# Patient Record
Sex: Female | Born: 1990 | Race: Black or African American | Hispanic: No | Marital: Single | Smoking: Never smoker
Health system: Southern US, Community
[De-identification: ages and names within clinical notes are randomized; demographics above are authoritative.]

## PROBLEM LIST (undated history)

## (undated) DIAGNOSIS — R87629 Unspecified abnormal cytological findings in specimens from vagina: Secondary | ICD-10-CM

## (undated) DIAGNOSIS — G35 Multiple sclerosis: Secondary | ICD-10-CM

## (undated) HISTORY — PX: ACHILLES TENDON SURGERY: SHX542

## (undated) HISTORY — DX: Unspecified abnormal cytological findings in specimens from vagina: R87.629

## (undated) HISTORY — PX: ACHILLES TENDON REPAIR: SUR1153

## (undated) HISTORY — PX: WISDOM TOOTH EXTRACTION: SHX21

---

## 2015-07-28 ENCOUNTER — Emergency Department (HOSPITAL_BASED_OUTPATIENT_CLINIC_OR_DEPARTMENT_OTHER): Payer: PRIVATE HEALTH INSURANCE

## 2015-07-28 ENCOUNTER — Encounter (HOSPITAL_BASED_OUTPATIENT_CLINIC_OR_DEPARTMENT_OTHER): Payer: Self-pay | Admitting: Emergency Medicine

## 2015-07-28 ENCOUNTER — Emergency Department (HOSPITAL_BASED_OUTPATIENT_CLINIC_OR_DEPARTMENT_OTHER)
Admission: EM | Admit: 2015-07-28 | Discharge: 2015-07-28 | Disposition: A | Discharge status: Home / Self Care | Payer: PRIVATE HEALTH INSURANCE | Attending: Emergency Medicine | Admitting: Emergency Medicine

## 2015-07-28 DIAGNOSIS — Y998 Other external cause status: Secondary | ICD-10-CM | POA: Insufficient documentation

## 2015-07-28 DIAGNOSIS — Z23 Encounter for immunization: Secondary | ICD-10-CM | POA: Diagnosis not present

## 2015-07-28 DIAGNOSIS — Y92410 Unspecified street and highway as the place of occurrence of the external cause: Secondary | ICD-10-CM | POA: Insufficient documentation

## 2015-07-28 DIAGNOSIS — Z79899 Other long term (current) drug therapy: Secondary | ICD-10-CM | POA: Diagnosis not present

## 2015-07-28 DIAGNOSIS — S29001A Unspecified injury of muscle and tendon of front wall of thorax, initial encounter: Secondary | ICD-10-CM | POA: Diagnosis not present

## 2015-07-28 DIAGNOSIS — S301XXA Contusion of abdominal wall, initial encounter: Secondary | ICD-10-CM

## 2015-07-28 DIAGNOSIS — Y9389 Activity, other specified: Secondary | ICD-10-CM | POA: Insufficient documentation

## 2015-07-28 DIAGNOSIS — S298XXA Other specified injuries of thorax, initial encounter: Secondary | ICD-10-CM

## 2015-07-28 DIAGNOSIS — S7001XA Contusion of right hip, initial encounter: Secondary | ICD-10-CM | POA: Diagnosis not present

## 2015-07-28 DIAGNOSIS — S70211A Abrasion, right hip, initial encounter: Secondary | ICD-10-CM | POA: Insufficient documentation

## 2015-07-28 DIAGNOSIS — Z3202 Encounter for pregnancy test, result negative: Secondary | ICD-10-CM | POA: Diagnosis not present

## 2015-07-28 DIAGNOSIS — S79911A Unspecified injury of right hip, initial encounter: Secondary | ICD-10-CM | POA: Diagnosis present

## 2015-07-28 LAB — PREGNANCY, URINE: Preg Test, Ur: NEGATIVE

## 2015-07-28 MED ORDER — HYDROCODONE-ACETAMINOPHEN 5-325 MG PO TABS: 1.0000 | ORAL_TABLET | Freq: Four times a day (QID) | ORAL | Status: DC | PRN

## 2015-07-28 MED ORDER — TETANUS-DIPHTH-ACELL PERTUSSIS 5-2.5-18.5 LF-MCG/0.5 IM SUSP
0.5000 mL | Freq: Once | INTRAMUSCULAR | Status: AC
  Administered 2015-07-28: 0.5 mL via INTRAMUSCULAR
  Filled 2015-07-28: qty 0.5

## 2015-07-28 MED ORDER — HYDROCODONE-ACETAMINOPHEN 5-325 MG PO TABS
1.0000 | ORAL_TABLET | Freq: Once | ORAL | Status: AC
  Administered 2015-07-28: 1 via ORAL
  Filled 2015-07-28: qty 1

## 2015-07-28 NOTE — ED Notes (Signed)
Pt rates pain 9/10 while sitting in bed.  There are no nonverbal signs of pain. Pt smiling and interacting with staff and family normally.

## 2015-07-28 NOTE — ED Notes (Signed)
Wound care done to abrasion on right groin area.  Cleansed with Saf Clens.  Abx ointment applied followed by nonstick dsg, gauze and Tegaderm.

## 2015-07-28 NOTE — ED Notes (Signed)
MD at bedside. 

## 2015-07-28 NOTE — ED Notes (Signed)
MD at bedside discussing test results and dispo plan of care. 
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
## 2015-08-22 DIAGNOSIS — G35 Multiple sclerosis: Secondary | ICD-10-CM | POA: Insufficient documentation

## 2015-09-29 DIAGNOSIS — Q2112 Patent foramen ovale: Secondary | ICD-10-CM | POA: Insufficient documentation
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
## 2016-09-28 DIAGNOSIS — H469 Unspecified optic neuritis: Secondary | ICD-10-CM | POA: Insufficient documentation

## 2016-09-28 DIAGNOSIS — G35 Multiple sclerosis: Secondary | ICD-10-CM | POA: Insufficient documentation

## 2019-04-23 DIAGNOSIS — D849 Immunodeficiency, unspecified: Secondary | ICD-10-CM | POA: Insufficient documentation

## 2019-10-15 ENCOUNTER — Emergency Department (INDEPENDENT_AMBULATORY_CARE_PROVIDER_SITE_OTHER)
Admission: EM | Admit: 2019-10-15 | Discharge: 2019-10-15 | Disposition: A | Discharge status: Home / Self Care | Payer: BLUE CROSS/BLUE SHIELD | Source: Home / Self Care

## 2019-10-15 ENCOUNTER — Other Ambulatory Visit: Payer: Self-pay

## 2019-10-15 ENCOUNTER — Encounter: Payer: Self-pay | Admitting: Emergency Medicine

## 2019-10-15 ENCOUNTER — Emergency Department (INDEPENDENT_AMBULATORY_CARE_PROVIDER_SITE_OTHER): Payer: BLUE CROSS/BLUE SHIELD

## 2019-10-15 DIAGNOSIS — M25571 Pain in right ankle and joints of right foot: Secondary | ICD-10-CM

## 2019-10-15 DIAGNOSIS — S86001A Unspecified injury of right Achilles tendon, initial encounter: Secondary | ICD-10-CM

## 2019-10-15 DIAGNOSIS — M7660 Achilles tendinitis, unspecified leg: Secondary | ICD-10-CM

## 2019-10-15 DIAGNOSIS — M79661 Pain in right lower leg: Secondary | ICD-10-CM

## 2019-10-15 HISTORY — DX: Multiple sclerosis: G35

## 2019-10-15 NOTE — Discharge Instructions (Signed)
  You may take 500mg  acetaminophen every 4-6 hours or in combination with ibuprofen 400-600mg  every 6-8 hours as needed for pain and inflammation.   Naproxen/Aleve is okay to take instead of ibuprofen if you prefer.  Call Sports Medicine or an Orthopedist tomorrow to schedule a follow up visit for further evaluation and treatment of your suspected partial achilles tear.  You may remove the boot to bath and apply a cool compress, keep foot elevated to help with pain and swelling. Try to avoid putting weight on your foot until you follow up with the specialist.

## 2019-10-15 NOTE — ED Triage Notes (Signed)
RT ankle, foot injury today playing basketball, 5/10

## 2019-10-15 NOTE — ED Provider Notes (Addendum)
[facility]    CSN: [id number] Arrival date & time: 10/15/19  1837      History   Chief Complaint Chief Complaint  Patient presents with  . Ankle Injury    HPI Teresa Davila is a 29 y.o. female.   HPI Teresa Davila is a 29 y.o. female presenting to UC with c/o sudde onset Right ankle pain that occurred about 2 hours PTA, hyper dorsiflexed foot while playing basketball. Pain is aching and sore into her Right calf. Minimal pain at rest. No medication taken PTA.     Past Medical History:  Diagnosis Date  . Multiple sclerosis (HCC)     There are no problems to display for this patient.   History reviewed. No pertinent surgical history.  OB History   No obstetric history on file.      Home Medications    Prior to Admission medications   Medication Sig Start Date End Date Taking? Authorizing Provider  ocrelizumab 300 mg in sodium chloride 0.9 % 250 mL Inject 300 mg into the vein once.   Yes [provider]  cetirizine (ZYRTEC) 10 MG tablet Take 10 mg by mouth daily.    [provider]    Family History Family History  Problem Relation Age of Onset  . Healthy Mother     Social History Social History   Tobacco Use  . Smoking status: Never Smoker  . Smokeless tobacco: Never Used  Vaping Use  . Vaping Use: Never used  Substance Use Topics  . Alcohol use: Yes  . Drug use: No     Allergies   Patient has no known allergies.   Review of Systems Review of Systems  Musculoskeletal: Positive for arthralgias, gait problem and myalgias. Negative for joint swelling.  Skin: Negative for color change and wound.     Physical Exam Triage Vital Signs ED Triage Vitals [10/15/19 1857]  Enc Vitals Group     BP 127/81     Pulse Rate 93     Resp      Temp 98.7 F (37.1 C)     Temp Source Oral     SpO2      Weight 171 lb (77.6 kg)     Height 5\' 2"  (1.575 m)     Head Circumference      Peak Flow      Pain Score 5     Pain  Loc      Pain Edu?      Excl. in GC?    No data found.  Updated Vital Signs BP 127/81 (BP Location: Right Arm)   Pulse 93   Temp 98.7 F (37.1 C) (Oral)   Ht 5\' 2"  (1.575 m)   Wt 171 lb (77.6 kg)   LMP 09/29/2019 (Exact Date)   BMI 31.28 kg/m   Visual Acuity Right Eye Distance:   Left Eye Distance:   Bilateral Distance:    Right Eye Near:   Left Eye Near:    Bilateral Near:     Physical Exam Vitals and nursing note reviewed.  Constitutional:      Appearance: Normal appearance. She is well-developed.  HENT:     Head: Normocephalic and atraumatic.  Cardiovascular:     Rate and Rhythm: Normal rate.     Pulses:          Dorsalis pedis pulses are 2+ on the right side.       Posterior tibial pulses are 2+ on the right  side.  Pulmonary:     Effort: Pulmonary effort is normal.  Musculoskeletal:        General: Normal range of motion.     Cervical back: Normal range of motion.       Legs:     Comments: Right ankle: no obvious deformity or swelling. Mild tenderness to mid calf and achilles. Muscle compartment is soft. Full ROM, increased pain with end range.  No tenderness over malleoli.  No tenderness to foot.  Full ROM knee, ankle and toes.  Skin:    General: Skin is warm and dry.     Findings: No bruising or erythema.  Neurological:     Mental Status: She is alert and oriented to person, place, and time.  Psychiatric:        Behavior: Behavior normal.      UC Treatments / Results  Labs (all labs ordered are listed, but only abnormal results are displayed) Labs Reviewed - No data to display  EKG   Radiology DG Ankle Complete Right  Result Date: 10/15/2019 CLINICAL DATA:  Injured right ankle 2 hours ago playing basketball, pain in Achilles region EXAM: RIGHT ANKLE - COMPLETE 3+ VIEW COMPARISON:  None. FINDINGS: Frontal, oblique, and lateral views of the right ankle are obtained. There is soft tissue edema within the posterior hindfoot in the region of the  Achilles. The Achilles tendon is not poorly visualized, please correlate with physical exam findings for possible Achilles tendon rupture. No acute displaced fracture, subluxation, or dislocation. The joint spaces are well preserved. IMPRESSION: 1. Soft tissue edema in the posterior ankle, with poor visualization of the Achilles tendon. This may reflect rupture, please correlate with physical exam findings. 2. No acute fracture. Electronically Signed   By: Sharlet Salina M.D.   On: 10/15/2019 19:36    Procedures Procedures (including critical care time)  Medications Ordered in UC Medications - No data to display  Initial Impression / Assessment and Plan / UC Course  I have reviewed the triage vital signs and the nursing notes.  Pertinent labs & imaging results that were available during my care of the patient were reviewed by me and considered in my medical decision making (see chart for details).     Reviewed imaging with pt Concern for partial achilles tendon tear. Will place pt in tall walking boot, crutches provided to remain non-weightbearing. Encouraged to call orthopedist or sports medicine tomorrow to schedule further evaluation of injury. Due to insurance, pt would like to go to [facility], encouraged to call her insurance for recommendations on in-network providers. AVS given  Final Clinical Impressions(s) / UC Diagnoses   Final diagnoses:  Right ankle pain  Achilles tendon pain  Achilles tendon injury, right, initial encounter  Right calf pain     Discharge Instructions      You may take 500mg  acetaminophen every 4-6 hours or in combination with ibuprofen 400-600mg  every 6-8 hours as needed for pain and inflammation.   Naproxen/Aleve is okay to take instead of ibuprofen if you prefer.  Call Sports Medicine or an Orthopedist tomorrow to schedule a follow up visit for further evaluation and treatment of your suspected partial achilles tear.  You may remove the boot  to bath and apply a cool compress, keep foot elevated to help with pain and swelling. Try to avoid putting weight on your foot until you follow up with the specialist.     ED Prescriptions    None     I  have reviewed the PDMP during this encounter.   Lurene Shadow, PA-C 10/15/19 1957    Lurene Shadow, PA-C 10/15/19 1959

## 2020-05-25 ENCOUNTER — Other Ambulatory Visit (HOSPITAL_COMMUNITY)
Admission: RE | Admit: 2020-05-25 | Discharge: 2020-05-25 | Disposition: A | Discharge status: Home / Self Care | Payer: 59 | Source: Ambulatory Visit | Attending: Obstetrics and Gynecology | Admitting: Obstetrics and Gynecology

## 2020-05-25 ENCOUNTER — Other Ambulatory Visit: Payer: Self-pay

## 2020-05-25 ENCOUNTER — Encounter: Payer: Self-pay | Admitting: Obstetrics and Gynecology

## 2020-05-25 ENCOUNTER — Ambulatory Visit (INDEPENDENT_AMBULATORY_CARE_PROVIDER_SITE_OTHER): Payer: 59 | Admitting: Obstetrics and Gynecology

## 2020-05-25 VITALS — BP 126/87 | HR 83 | Ht 62.0 in | Wt 185.0 lb

## 2020-05-25 DIAGNOSIS — Z113 Encounter for screening for infections with a predominantly sexual mode of transmission: Secondary | ICD-10-CM

## 2020-05-25 NOTE — Progress Notes (Signed)
Last pap- 2021- normal

## 2020-05-25 NOTE — Progress Notes (Signed)
    GYNECOLOGY ENCOUNTER NOTE  History:     Teresa Davila is a 30 y.o. G0P0000 female here for STI screening. She recently had sex with her partner and the condom broke. No discharge or odor. Just wants to be screened. Last pap and annual was with [facility] and was normal. Results can be found in care everywhere.      Gynecologic History Patient's last menstrual period was 05/20/2020. Contraception: none Last Pap: 02/11/20 & 08/19/2017. Results were: normal with negative HPV, 2019 results in care everywhere.  Last mammogram: NA  Obstetric History OB History  Gravida Para Term Preterm AB Living  0 0 0 0 0 0  SAB IAB Ectopic Multiple Live Births  0 0 0 0 0    Past Medical History:  Diagnosis Date  . Multiple sclerosis (HCC)     Past Surgical History:  Procedure Laterality Date  . ACHILLES TENDON REPAIR      Current Outpatient Medications on File Prior to Visit  Medication Sig Dispense Refill  . Multiple Vitamin (MULTIVITAMIN) LIQD Take 5 mLs by mouth daily.     No current facility-administered medications on file prior to visit.    No Known Allergies  Social History:  reports that she has never smoked. She has never used smokeless tobacco. She reports current alcohol use. She reports that she does not use drugs.  Family History  Problem Relation Age of Onset  . Healthy Mother     The following portions of the patient's history were reviewed and updated as appropriate: allergies, current medications, past family history, past medical history, past social history, past surgical history and problem list.  Review of Systems Pertinent items noted in HPI and remainder of comprehensive ROS otherwise negative.  Physical Exam:  BP 126/87   Pulse 83   Ht 5\' 2"  (1.575 m)   Wt 185 lb (83.9 kg)   LMP 05/20/2020   BMI 33.84 kg/m  CONSTITUTIONAL: Well-developed, well-nourished female in no acute distress.  HENT:  Normocephalic, atraumatic, External right and left ear normal.   MUSCULOSKELETAL: Normal range of motion. No tenderness.   NEUROLOGIC: Alert and oriented to person, place, and time.  PSYCHIATRIC: Normal mood and affect. Normal behavior. Normal judgment and thought content. CARDIOVASCULAR: Normal heart rate noted, regular rhythm PELVIC: Normal appearing external genitalia and urethral meatus.  Normal uterine size, no other palpable masses, no uterine or adnexal tenderness.  Performed in the presence of a chaperone.   Assessment and Plan:  1. Routine screening for STI (sexually transmitted infection)  - HIV antibody (with reflex) - RPR - Hepatitis C Antibody - [facility] - Annual do Oct 2022   Routine preventative health maintenance measures emphasized. Please refer to After Visit Summary for other counseling recommendations.   Rasch, Nov 2022, NP [facility], [facility]

## 2020-05-26 LAB — HEPATITIS C ANTIBODY
Hepatitis C Ab: NONREACTIVE
SIGNAL TO CUT-OFF: 0.02 (ref <1.00)

## 2020-05-26 LAB — RPR: RPR Ser Ql: NONREACTIVE

## 2020-05-26 LAB — HIV ANTIBODY (ROUTINE TESTING W REFLEX): HIV 1&2 Ab, 4th Generation: NONREACTIVE

## 2020-05-27 LAB — CERVICOVAGINAL ANCILLARY ONLY
Bacterial Vaginitis (gardnerella): POSITIVE — AB
Candida Glabrata: NEGATIVE
Candida Vaginitis: NEGATIVE
Chlamydia: NEGATIVE
Comment: NEGATIVE
Comment: NEGATIVE
Comment: NEGATIVE
Comment: NEGATIVE
Comment: NEGATIVE
Comment: NORMAL
Neisseria Gonorrhea: NEGATIVE
Trichomonas: NEGATIVE

## 2020-06-23 DIAGNOSIS — G35 Multiple sclerosis: Secondary | ICD-10-CM | POA: Diagnosis not present

## 2020-08-15 ENCOUNTER — Other Ambulatory Visit: Payer: Self-pay

## 2020-08-15 ENCOUNTER — Encounter: Payer: Self-pay | Admitting: Medical-Surgical

## 2020-08-15 ENCOUNTER — Ambulatory Visit: Payer: 59 | Admitting: Medical-Surgical

## 2020-08-15 VITALS — BP 120/78 | HR 90 | Temp 99.1°F | Ht 62.5 in | Wt 187.9 lb

## 2020-08-15 DIAGNOSIS — Z0000 Encounter for general adult medical examination without abnormal findings: Secondary | ICD-10-CM

## 2020-08-15 DIAGNOSIS — Z7689 Persons encountering health services in other specified circumstances: Secondary | ICD-10-CM | POA: Diagnosis not present

## 2020-08-15 NOTE — Progress Notes (Signed)
New Patient Office Visit  Subjective:  Patient ID: Teresa Davila, female    DOB: 07-23-1990  Age: 30 y.o. MRN: [id number]  CC:  Chief Complaint  Patient presents with  . Establish Care    HPI Teresa Davila presents to establish care.  MS- well controlled at this time. It's been a while since the last flare. Does Ocrevus infusions every 6 months.   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 UTD on Pap smears  Past Medical History:  Diagnosis Date  . Multiple sclerosis (HCC)     Past Surgical History:  Procedure Laterality Date  . ACHILLES TENDON REPAIR Right     Family History  Problem Relation Age of Onset  . Healthy Mother   . Cancer Maternal Grandmother     Social History   Socioeconomic History  . Marital status: Single    Spouse name: Not on file  . Number of children: Not on file  . Years of education: Not on file  . Highest education level: Not on file  Occupational History  . Not on file  Tobacco Use  . Smoking status: Never Smoker  . Smokeless tobacco: Never Used  Vaping Use  . Vaping Use: Never used  Substance and Sexual Activity  . Alcohol use: Yes    Comment: occasionally  . Drug use: Never  . Sexual activity: Yes  Other Topics Concern  . Not on file  Social History Narrative  . Not on file   Social Determinants of Health   Financial Resource Strain: Not on file  Food Insecurity: Not on file  Transportation Needs: Not on file  Physical Activity: Not on file  Stress: Not on file  Social Connections: Not on file  Intimate Partner Violence: Not on file    ROS Review of Systems  Constitutional:  Negative for chills, fatigue, fever and unexpected weight change.  Eyes: Negative for visual disturbance.  Respiratory: Negative for cough, chest tightness, shortness of breath and wheezing.   Cardiovascular: Negative for chest pain, palpitations and leg swelling.  Gastrointestinal: Negative for abdominal pain, constipation, diarrhea, nausea and vomiting.  Endocrine: Positive for heat intolerance. Negative for cold intolerance, polydipsia, polyphagia and polyuria.  Genitourinary: Negative for dysuria, frequency and urgency.  Allergic/Immunologic: Positive for environmental allergies.  Neurological: Negative for dizziness, light-headedness and headaches.  Psychiatric/Behavioral: Negative for dysphoric mood, self-injury, sleep disturbance and suicidal ideas. The patient is not nervous/anxious.    Objective:   Today's Vitals: BP 120/78   Pulse 90   Temp 99.1 F (37.3 C)   Ht 5' 2.5" (1.588 m)   Wt 187 lb 14.4 oz (85.2 kg)   LMP 08/07/2020   SpO2 96%   BMI 33.82 kg/m   Physical Exam Vitals and nursing note reviewed. Exam conducted with a chaperone present.  Constitutional:      General: She is not in acute distress.    Appearance: Normal appearance. She is obese. She is not ill-appearing.  HENT:     Head: Normocephalic and atraumatic.     Right Ear: Tympanic membrane, ear canal and external ear normal. There is no impacted cerumen.     Left Ear: Tympanic membrane, ear canal and external  ear normal. There is no impacted cerumen.     Nose:     Right Turbinates: Not swollen.     Left Turbinates: Not swollen.     Right Sinus: No maxillary sinus tenderness or frontal sinus tenderness.     Left Sinus: No maxillary sinus tenderness or frontal sinus tenderness.     Mouth/Throat:     Mouth: No oral lesions.     Tonsils: No tonsillar exudate or tonsillar abscesses.  Eyes:     General:        Right eye: No discharge.        Left eye: No discharge.     Extraocular Movements: Extraocular  movements intact.     Conjunctiva/sclera: Conjunctivae normal.     Pupils: Pupils are equal, round, and reactive to light.  Neck:     Thyroid: No thyromegaly.     Vascular: No carotid bruit.  Cardiovascular:     Rate and Rhythm: Normal rate and regular rhythm.     Pulses: Normal pulses.     Heart sounds: Normal heart sounds. No murmur heard. No friction rub. No gallop.   Pulmonary:     Effort: Pulmonary effort is normal. No respiratory distress.     Breath sounds: Normal breath sounds. No decreased breath sounds or wheezing.  Chest:  Breasts: Breasts are symmetrical.     Right: Normal.     Left: Normal.    Abdominal:     General: Bowel sounds are normal. There is no distension.     Palpations: Abdomen is soft. There is no hepatomegaly, splenomegaly or mass.     Tenderness: There is no abdominal tenderness. There is no right CVA tenderness, left CVA tenderness, guarding or rebound.  Genitourinary:    General: Normal vulva.     Pubic Area: No rash.      Vagina: No vaginal discharge.     Cervix: No friability.     Uterus: Normal. Not tender and no uterine prolapse.      Adnexa: Right adnexa normal and left adnexa normal.       Right: No mass or tenderness.         Left: No mass or tenderness.       Rectum: Normal.  Musculoskeletal:        General: No swelling or tenderness. Normal range of motion.     Right shoulder: Normal.     Left shoulder: Normal.     Right upper arm: Normal.     Left upper arm: Normal.     Right elbow: Normal.     Left elbow: Normal.     Right forearm: Normal.     Left forearm: Normal.     Right wrist: Normal.     Left wrist: Normal.     Right hand: Normal.     Left hand: Normal.     Cervical back: Normal, normal range of motion and neck supple. No tenderness.     Thoracic back: Normal.     Lumbar back: Normal.     Right lower leg: Normal. No edema.     Left lower leg: Normal. No edema.     Right ankle: Normal.     Right Achilles Tendon:  Normal.     Left ankle: Normal.     Left Achilles Tendon: Normal.     Right foot: Normal.     Left foot: Normal.  Lymphadenopathy:     Cervical: No cervical adenopathy.  Skin:  General: Skin is warm and dry.     Coloration: Skin is not pale.     Findings: Lesion (Small pink lesion in the left inguinal crease of the pubic mons, no drainage or erythema, healing well.) present. No rash.  Neurological:     Mental Status: She is alert and oriented to person, place, and time.     Sensory: No sensory deficit.     Motor: No weakness.     Gait: Gait normal.     Deep Tendon Reflexes: Reflexes are normal and symmetric.  Psychiatric:        Attention and Perception: Attention normal.        Mood and Affect: Mood and affect normal.        Speech: Speech normal.        Behavior: Behavior normal.        Thought Content: Thought content normal.        Cognition and Memory: Cognition normal.        Judgment: Judgment normal.     Assessment & Plan:   1. Encounter to establish care Reviewed available information and discussed healthcare concerns with patient.  She is up-to-date on preventative care.  2. Annual physical exam Checking CBC, CMP, and lipid panel today. - CBC - COMPLETE METABOLIC PANEL WITH GFR - Lipid panel   Outpatient Encounter Medications as of 08/15/2020  Medication Sig  . loratadine (CLARITIN) 10 MG tablet Take 10 mg by mouth daily.  Marland Kitchen ocrelizumab 300 mg in sodium chloride 0.9 % 250 mL Inject 300 mg into the vein once.  . Multiple Vitamin (MULTIVITAMIN) LIQD Take 5 mLs by mouth daily.   No facility-administered encounter medications on file as of 08/15/2020.    Follow-up: Return in about 1 year (around 08/15/2021) for annual physical exam or sooner if needed.   Thayer Ohm, DNP, APRN, FNP-BC [facility]

## 2020-08-15 NOTE — Patient Instructions (Signed)
Preventive Care 21-29 Years Old, Female Preventive care refers to lifestyle choices and visits with your health care provider that can promote health and wellness. This includes:  A yearly physical exam. This is also called an annual wellness visit.  Regular dental and eye exams.  Immunizations.  Screening for certain conditions.  Healthy lifestyle choices, such as: ? Eating a healthy diet. ? Getting regular exercise. ? Not using drugs or products that contain nicotine and tobacco. ? Limiting alcohol use. What can I expect for my preventive care visit? Physical exam Your health care provider may check your:  Height and weight. These may be used to calculate your BMI (body mass index). BMI is a measurement that tells if you are at a healthy weight.  Heart rate and blood pressure.  Body temperature.  Skin for abnormal spots. Counseling Your health care provider may ask you questions about your:  Past medical problems.  Family's medical history.  Alcohol, tobacco, and drug use.  Emotional well-being.  Home life and relationship well-being.  Sexual activity.  Diet, exercise, and sleep habits.  Work and work environment.  Access to firearms.  Method of birth control.  Menstrual cycle.  Pregnancy history. What immunizations do I need? Vaccines are usually given at various ages, according to a schedule. Your health care provider will recommend vaccines for you based on your age, medical history, and lifestyle or other factors, such as travel or where you work.   What tests do I need? Blood tests  Lipid and cholesterol levels. These may be checked every 5 years starting at age 20.  Hepatitis C test.  Hepatitis B test. Screening  Diabetes screening. This is done by checking your blood sugar (glucose) after you have not eaten for a while (fasting).  STD (sexually transmitted disease) testing, if you are at risk.  BRCA-related cancer screening. This may be  done if you have a family history of breast, ovarian, tubal, or peritoneal cancers.  Pelvic exam and Pap test. This may be done every 3 years starting at age 21. Starting at age 30, this may be done every 5 years if you have a Pap test in combination with an HPV test. Talk with your health care provider about your test results, treatment options, and if necessary, the need for more tests.   Follow these instructions at home: Eating and drinking  Eat a healthy diet that includes fresh fruits and vegetables, whole grains, lean protein, and low-fat dairy products.  Take vitamin and mineral supplements as recommended by your health care provider.  Do not drink alcohol if: ? Your health care provider tells you not to drink. ? You are pregnant, may be pregnant, or are planning to become pregnant.  If you drink alcohol: ? Limit how much you have to 0-1 drink a day. ? Be aware of how much alcohol is in your drink. In the [place] one drink equals one 12 oz bottle of beer (355 mL), one 5 oz glass of wine (148 mL), or one 1 oz glass of hard liquor (44 mL).   Lifestyle  Take daily care of your teeth and gums. Brush your teeth every morning and night with fluoride toothpaste. Floss one time each day.  Stay active. Exercise for at least 30 minutes 5 or more days each week.  Do not use any products that contain nicotine or tobacco, such as cigarettes, e-cigarettes, and chewing tobacco. If you need help quitting, ask your health care provider.  Do not   use drugs.  If you are sexually active, practice safe sex. Use a condom or other form of protection to prevent STIs (sexually transmitted infections).  If you do not wish to become pregnant, use a form of birth control. If you plan to become pregnant, see your health care provider for a prepregnancy visit.  Find healthy ways to cope with stress, such as: ? Meditation, yoga, or listening to music. ? Journaling. ? Talking to a trusted  person. ? Spending time with friends and family. Safety  Always wear your seat belt while driving or riding in a vehicle.  Do not drive: ? If you have been drinking alcohol. Do not ride with someone who has been drinking. ? When you are tired or distracted. ? While texting.  Wear a helmet and other protective equipment during sports activities.  If you have firearms in your house, make sure you follow all gun safety procedures.  Seek help if you have been physically or sexually abused. What's next?  Go to your health care provider once a year for an annual wellness visit.  Ask your health care provider how often you should have your eyes and teeth checked.  Stay up to date on all vaccines. This information is not intended to replace advice given to you by your health care provider. Make sure you discuss any questions you have with your health care provider. Document Revised: 11/29/2019 Document Reviewed: 12/12/2017 Elsevier Patient Education  2021 Elsevier Inc.  

## 2020-08-16 ENCOUNTER — Encounter: Payer: Self-pay | Admitting: Medical-Surgical

## 2020-08-16 LAB — COMPLETE METABOLIC PANEL WITH GFR
AG Ratio: 1.6 (calc) (ref 1.0–2.5)
ALT: 12 U/L (ref 6–29)
AST: 14 U/L (ref 10–30)
Albumin: 4.4 g/dL (ref 3.6–5.1)
Alkaline phosphatase (APISO): 72 U/L (ref 31–125)
BUN: 13 mg/dL (ref 7–25)
CO2: 27 mmol/L (ref 20–32)
Calcium: 9.9 mg/dL (ref 8.6–10.2)
Chloride: 103 mmol/L (ref 98–110)
Creat: 0.89 mg/dL (ref 0.50–1.10)
GFR, Est African American: 102 mL/min/{1.73_m2} (ref >=60)
GFR, Est Non African American: 88 mL/min/{1.73_m2} (ref >=60)
Globulin: 2.7 g/dL (calc) (ref 1.9–3.7)
Glucose, Bld: 97 mg/dL (ref 65–139)
Potassium: 4.2 mmol/L (ref 3.5–5.3)
Sodium: 138 mmol/L (ref 135–146)
Total Bilirubin: 0.4 mg/dL (ref 0.2–1.2)
Total Protein: 7.1 g/dL (ref 6.1–8.1)

## 2020-08-16 LAB — CBC
HCT: 43.1 % (ref 35.0–45.0)
Hemoglobin: 14.4 g/dL (ref 11.7–15.5)
MCH: 30.3 pg (ref 27.0–33.0)
MCHC: 33.4 g/dL (ref 32.0–36.0)
MCV: 90.7 fL (ref 80.0–100.0)
MPV: 10.7 fL (ref 7.5–12.5)
Platelets: 448 10*3/uL — ABNORMAL HIGH (ref 140–400)
RBC: 4.75 10*6/uL (ref 3.80–5.10)
RDW: 12.1 % (ref 11.0–15.0)
WBC: 7.8 10*3/uL (ref 3.8–10.8)

## 2020-08-16 LAB — LIPID PANEL
Cholesterol: 237 mg/dL — ABNORMAL HIGH (ref <200)
HDL: 74 mg/dL (ref >=50)
LDL Cholesterol (Calc): 147 mg/dL (calc) — ABNORMAL HIGH
Non-HDL Cholesterol (Calc): 163 mg/dL (calc) — ABNORMAL HIGH (ref <130)
Total CHOL/HDL Ratio: 3.2 (calc) (ref <5.0)
Triglycerides: 63 mg/dL (ref <150)

## 2020-11-13 IMAGING — DX DG ANKLE COMPLETE 3+V*R*
3 series · 3 of 3 positions shown · non-contrast
Comparison: None.

CLINICAL DATA: Injured right ankle 2 hours ago playing basketball,
pain in Achilles region

EXAM:
RIGHT ANKLE - COMPLETE 3+ VIEW

[ankle ap]
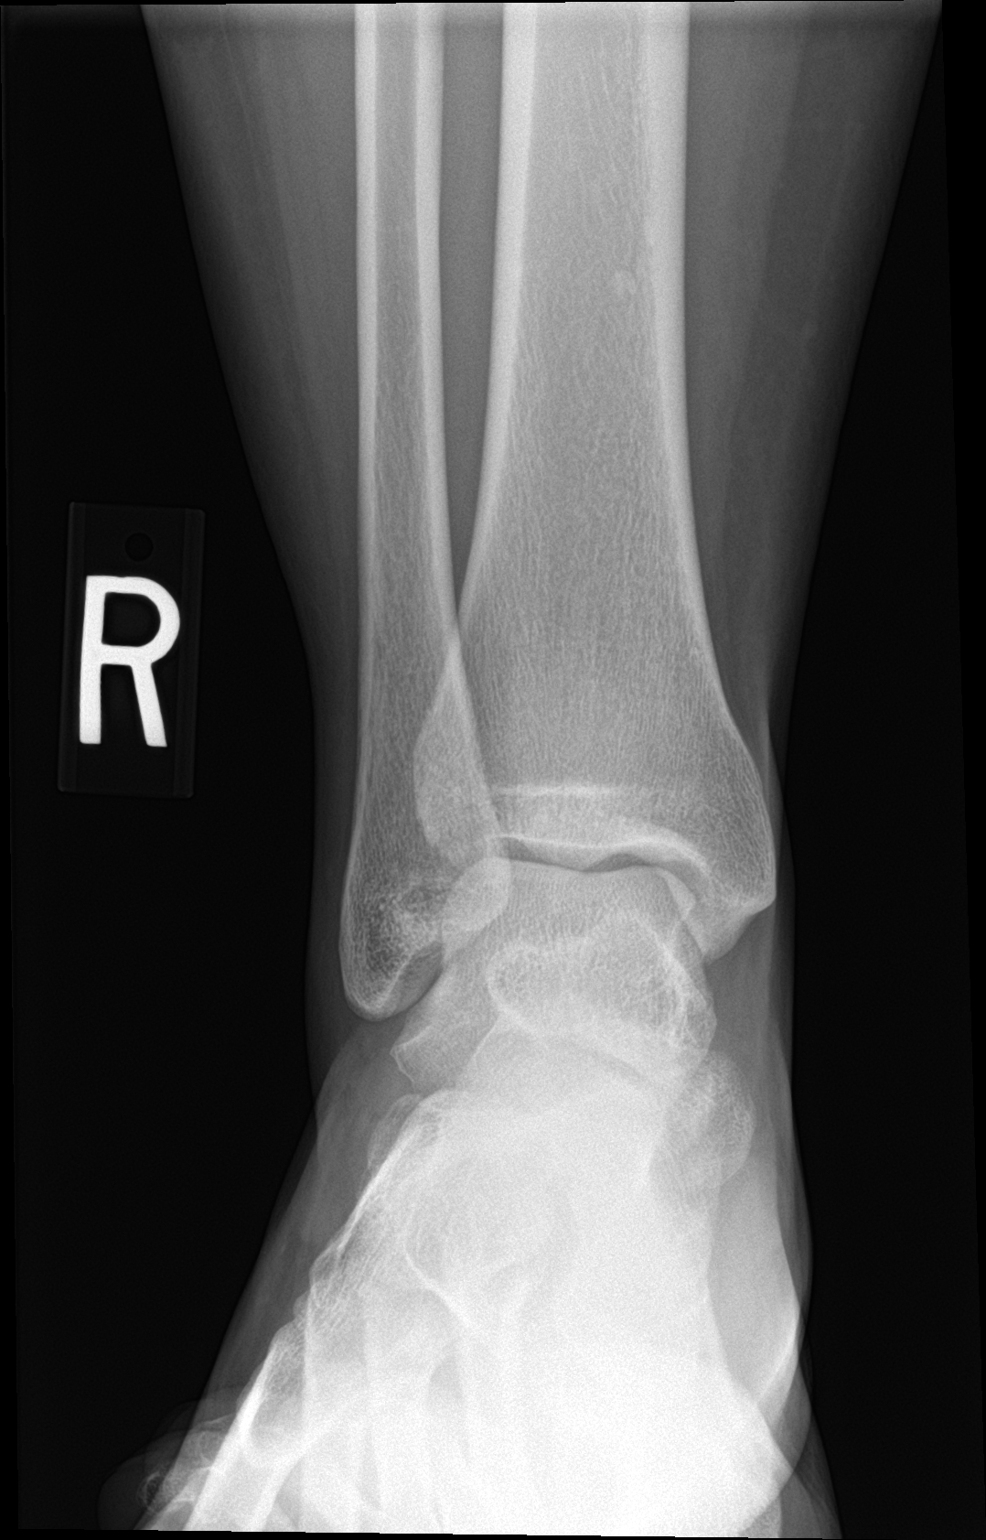

[ankle obl]
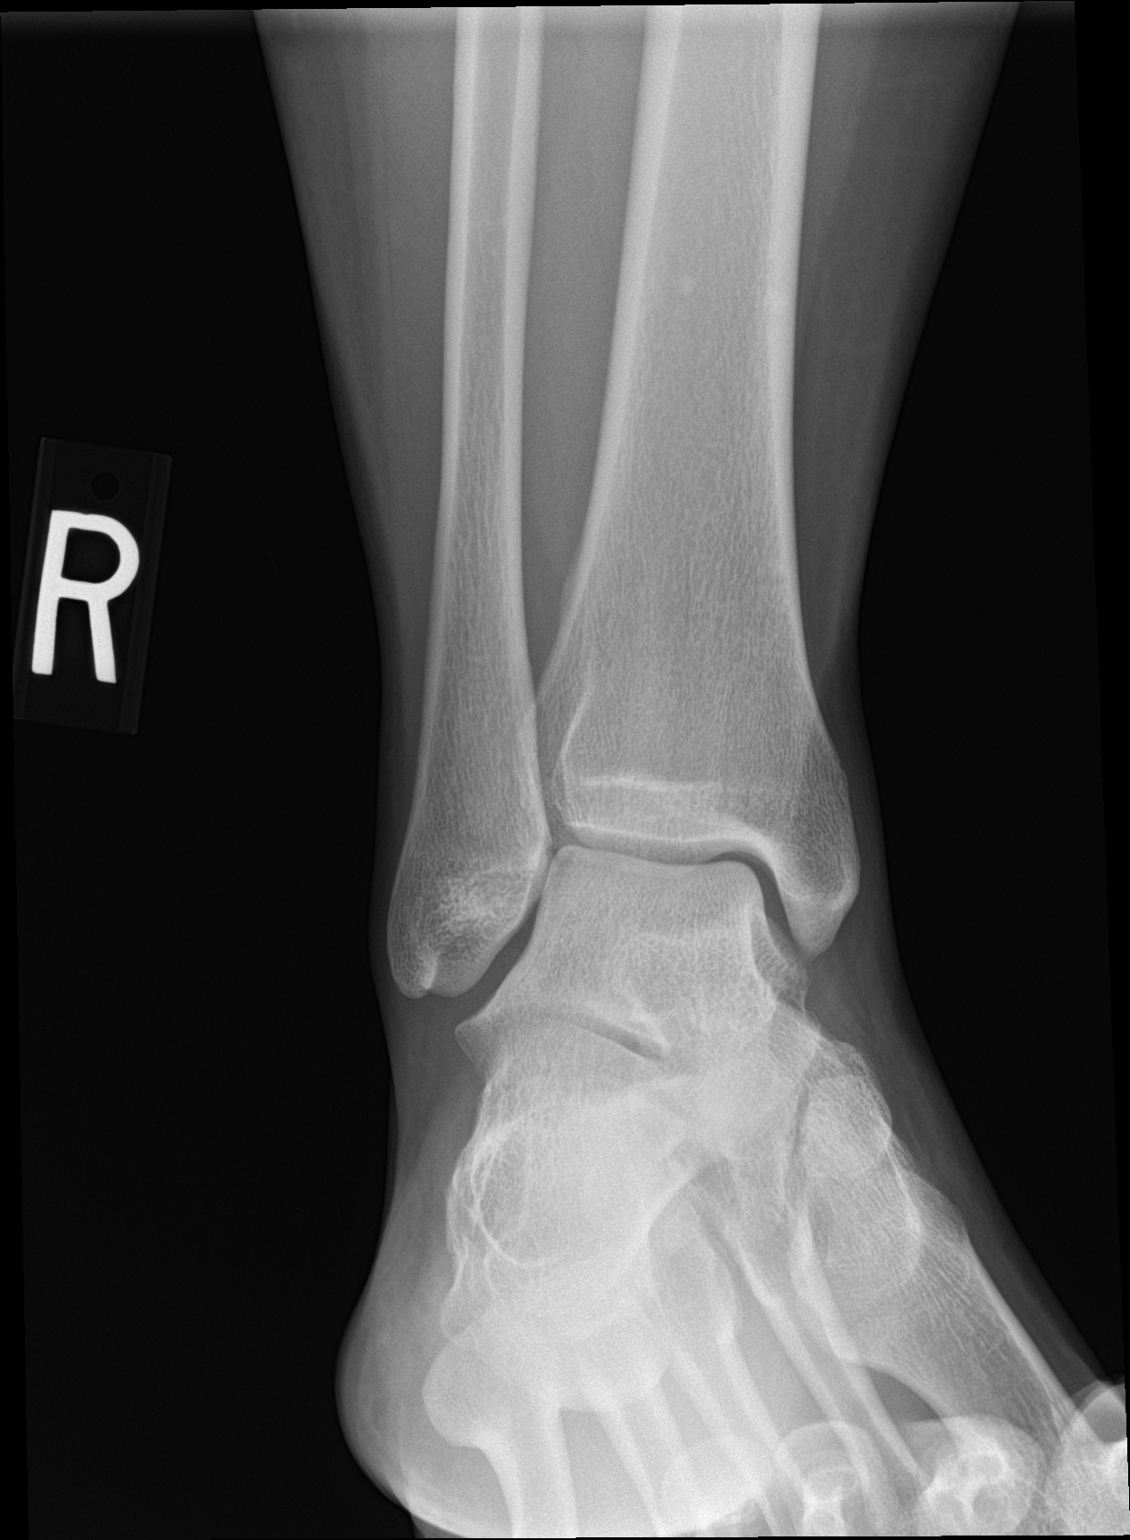

[ankle lat]
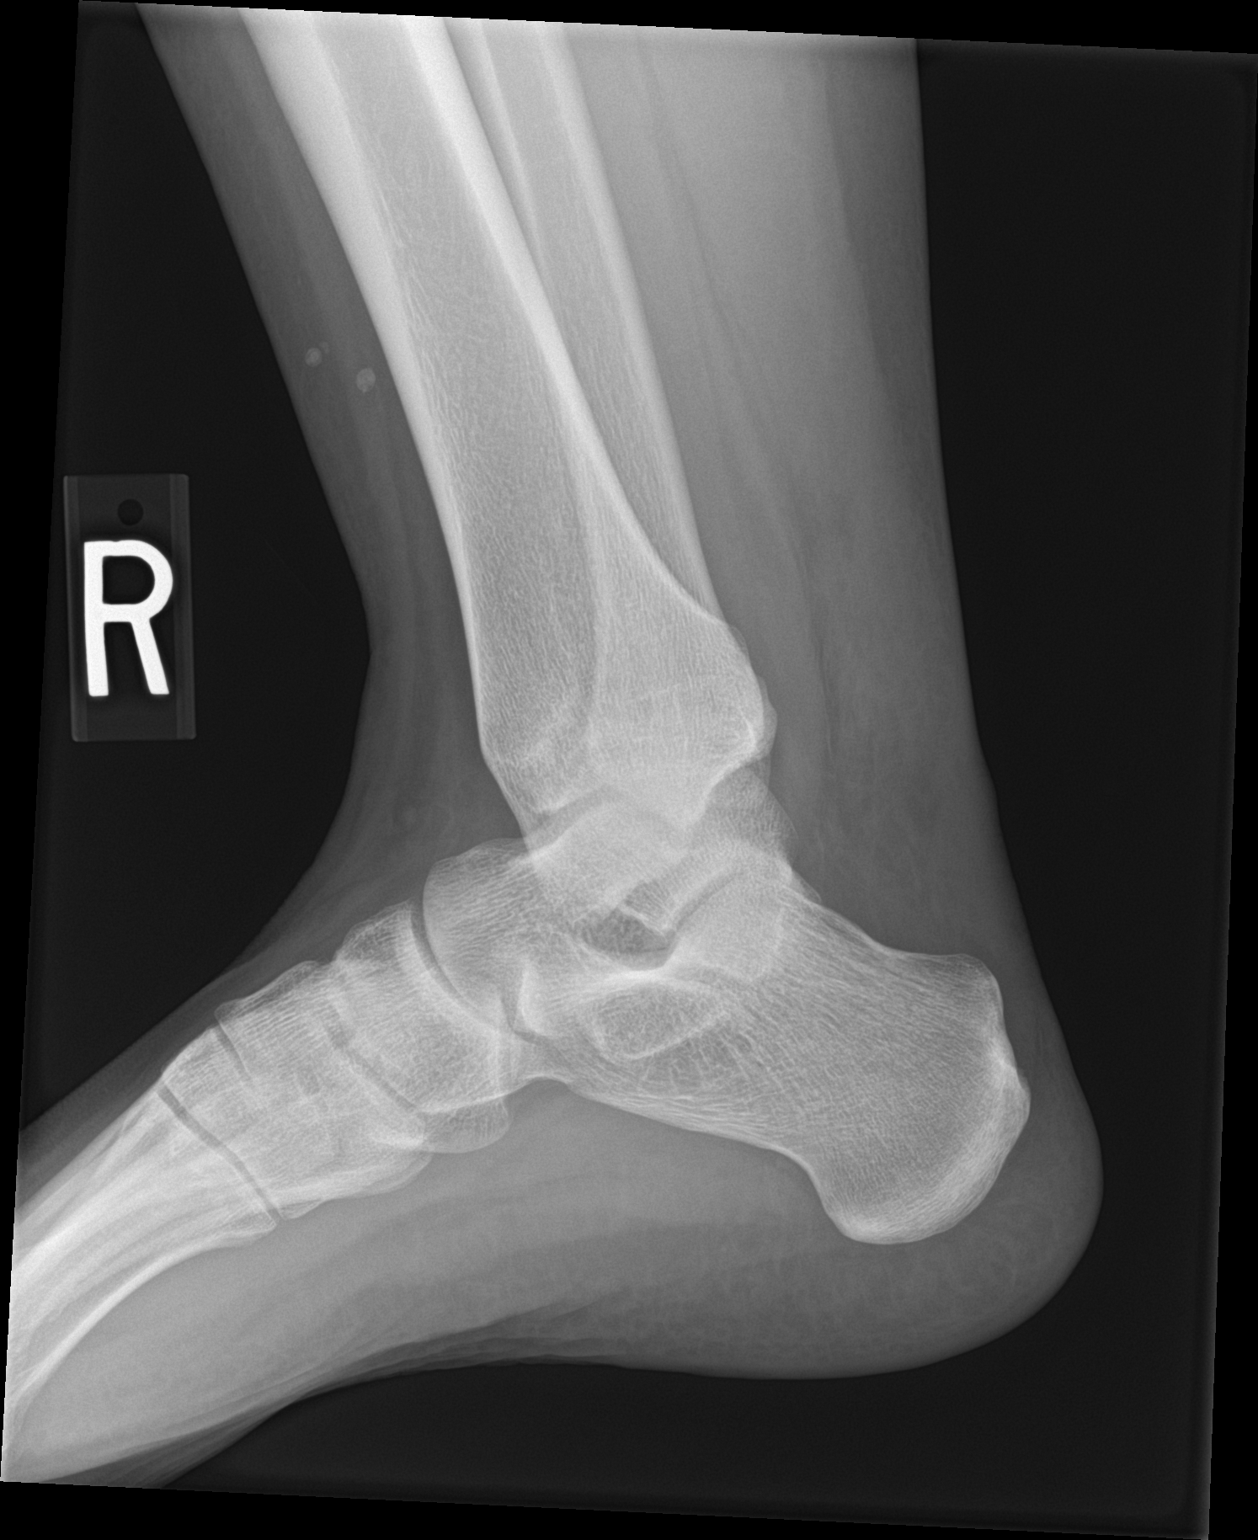

[3 of 3 positions shown; findings below may reference images not displayed]

FINDINGS: Frontal, oblique, and lateral views of the right ankle are obtained.
There is soft tissue edema within the posterior hindfoot in the
region of the Achilles. The Achilles tendon is not poorly
visualized, please correlate with physical exam findings for
possible Achilles tendon rupture.

No acute displaced fracture, subluxation, or dislocation. The joint
spaces are well preserved.
IMPRESSION: 1. Soft tissue edema in the posterior ankle, with poor visualization
of the Achilles tendon. This may reflect rupture, please correlate
with physical exam findings.
2. No acute fracture.

## 2020-12-29 DIAGNOSIS — L299 Pruritus, unspecified: Secondary | ICD-10-CM | POA: Diagnosis not present

## 2020-12-29 DIAGNOSIS — G35 Multiple sclerosis: Secondary | ICD-10-CM | POA: Diagnosis not present

## 2021-03-23 ENCOUNTER — Other Ambulatory Visit (HOSPITAL_COMMUNITY)
Admission: RE | Admit: 2021-03-23 | Discharge: 2021-03-23 | Disposition: A | Discharge status: Home / Self Care | Payer: 59 | Source: Ambulatory Visit | Attending: Obstetrics and Gynecology | Admitting: Obstetrics and Gynecology

## 2021-03-23 ENCOUNTER — Encounter: Payer: Self-pay | Admitting: Obstetrics and Gynecology

## 2021-03-23 ENCOUNTER — Ambulatory Visit (INDEPENDENT_AMBULATORY_CARE_PROVIDER_SITE_OTHER): Payer: 59 | Admitting: Obstetrics and Gynecology

## 2021-03-23 ENCOUNTER — Other Ambulatory Visit: Payer: Self-pay

## 2021-03-23 VITALS — BP 129/87 | HR 96 | Resp 16 | Ht 62.0 in | Wt 187.0 lb

## 2021-03-23 DIAGNOSIS — N898 Other specified noninflammatory disorders of vagina: Secondary | ICD-10-CM | POA: Diagnosis not present

## 2021-03-23 MED ORDER — METRONIDAZOLE 0.75 % VA GEL
1.0000 | Freq: Every day | VAGINAL | 1 refills | Status: DC
Start: 2021-03-23 — End: 2021-05-05

## 2021-03-23 NOTE — Progress Notes (Signed)
   GYNECOLOGY OFFICE VISIT NOTE  History:   Teresa Davila is a 30 y.o. G0P0000 here today for vaginal discharge and odor.   She has had the symptoms since her last period. The discharge has been constant. The odor has come and gone. She does note that she had a waxing and then used a cleanser after her period that she got from the lady who does her waxing. She denies issues with sexual activity I.e. pain with intercourse or PCB. The discharge is enough that it is bothersome to her and more than usual. She has not had this recurrently in the past.   She denies itching/burning.   She denies any abnormal bleeding, pelvic pain or other concerns.     Past Medical History:  Diagnosis Date   Multiple sclerosis [facility])     Past Surgical History:  Procedure Laterality Date   ACHILLES TENDON REPAIR Right     The following portions of the patient's history were reviewed and updated as appropriate: allergies, current medications, past family history, past medical history, past social history, past surgical history and problem list.   Health Maintenance:   Pap normal and HPV negative in 08/2017  Review of Systems:  Pertinent items noted in HPI and remainder of comprehensive ROS otherwise negative.  Physical Exam:  BP 129/87   Pulse 96   Resp 16   Ht 5\' 2"  (1.575 m)   Wt 187 lb (84.8 kg)   LMP 03/08/2021   BMI 34.20 kg/m  CONSTITUTIONAL: Well-developed, well-nourished female in no acute distress.  HEENT:  Normocephalic, atraumatic. External right and left ear normal. No scleral icterus.  NECK: Normal range of motion, supple, no masses noted on observation SKIN: No rash noted. Not diaphoretic. No erythema. No pallor. MUSCULOSKELETAL: Normal range of motion. No edema noted. NEUROLOGIC: Alert and oriented to person, place, and time. Normal muscle tone coordination. No cranial nerve deficit noted. PSYCHIATRIC: Normal mood and affect. Normal behavior. Normal judgment and thought  content.  CARDIOVASCULAR: Normal heart rate noted RESPIRATORY: Effort and breath sounds normal, no problems with respiration noted ABDOMEN: No masses noted. No other overt distention noted.    PELVIC: Normal appearing external genitalia; normal urethral meatus; normal appearing vaginal mucosa and cervix.  Remnants of something at the vaginal apex - appears like wax or leftover from the cleanser. Thin white discharge. No odor apparent but I am masked. Normal uterine size, no other palpable masses, no uterine or adnexal tenderness. Performed in the presence of a chaperone  Labs and Imaging No results found for this or any previous visit (from the past 168 hour(s)). No results found.  Assessment and Plan:  Vandora was seen today for vaginal discharge.  Diagnoses and all orders for this visit:  Vaginal discharge - Cultures done - Discussed vulvar hygiene.  - Sent metrogel to pharmacy in anticipation of likely positive (was positive in 05/2020 but was asymptomatic).  -     Cervicovaginal ancillary only( [place]) -     metroNIDAZOLE (METROGEL) 0.75 % vaginal gel; Place 1 Applicatorful vaginally at bedtime. Apply one applicatorful to vagina at bedtime for 5 days  Routine preventative health maintenance measures emphasized. Please refer to After Visit Summary for other counseling recommendations.   Return in about 6 months (around 09/21/2021) for annual.   11/21/2021, MD, FACOG Obstetrician & Gynecologist, [place] for [facility], [facility]

## 2021-03-23 NOTE — Progress Notes (Signed)
Pt has had vaginal odor and discharge since her LMP 03/08/21

## 2021-03-24 LAB — CERVICOVAGINAL ANCILLARY ONLY
Bacterial Vaginitis (gardnerella): POSITIVE — AB
Candida Glabrata: NEGATIVE
Candida Vaginitis: NEGATIVE
Chlamydia: NEGATIVE
Comment: NEGATIVE
Comment: NEGATIVE
Comment: NEGATIVE
Comment: NEGATIVE
Comment: NEGATIVE
Comment: NORMAL
Neisseria Gonorrhea: NEGATIVE
Trichomonas: NEGATIVE

## 2021-05-05 ENCOUNTER — Ambulatory Visit (INDEPENDENT_AMBULATORY_CARE_PROVIDER_SITE_OTHER): Payer: 59 | Admitting: Family Medicine

## 2021-05-05 ENCOUNTER — Encounter: Payer: Self-pay | Admitting: Family Medicine

## 2021-05-05 VITALS — BP 145/91 | HR 85 | Temp 98.7°F

## 2021-05-05 DIAGNOSIS — G35D Multiple sclerosis, unspecified: Secondary | ICD-10-CM

## 2021-05-05 DIAGNOSIS — R051 Acute cough: Secondary | ICD-10-CM | POA: Diagnosis not present

## 2021-05-05 DIAGNOSIS — H469 Unspecified optic neuritis: Secondary | ICD-10-CM | POA: Diagnosis not present

## 2021-05-05 DIAGNOSIS — D849 Immunodeficiency, unspecified: Secondary | ICD-10-CM

## 2021-05-05 DIAGNOSIS — J069 Acute upper respiratory infection, unspecified: Secondary | ICD-10-CM

## 2021-05-05 DIAGNOSIS — G35 Multiple sclerosis: Secondary | ICD-10-CM | POA: Diagnosis not present

## 2021-05-05 MED ORDER — BENZONATATE 200 MG PO CAPS: 200.0000 mg | ORAL_CAPSULE | Freq: Two times a day (BID) | ORAL | 0 refills | Status: DC | PRN

## 2021-05-05 MED ORDER — HYDROCODONE BIT-HOMATROP MBR 5-1.5 MG/5ML PO SOLN: 5.0000 mL | Freq: Every evening | ORAL | 0 refills | Status: DC | PRN

## 2021-05-05 NOTE — Progress Notes (Signed)
Acute Office Visit  Subjective:    Patient ID: Teresa Davila, female    DOB: 08-Apr-1991, 31 y.o.   MRN: [id number]  Chief Complaint  Patient presents with   Cough    HPI Patient is in today for cough for about a week and a half.  She says it did start with a mild sore throat and some sinus drainage and nasal congestion.  It then moved into her chest and has been causing a cough that is mostly dry during the day but more phlegmy and wet at night it is keeping her awake at night.  She says the sinus drainage and congestion is actually much better.  She does occasionally see some green-colored mucus.  No GI symptoms no ear pain.  Not currently really using any over-the-counter cough and cold medications.  Past Medical History:  Diagnosis Date   Multiple sclerosis ([place])     Past Surgical History:  Procedure Laterality Date   ACHILLES TENDON REPAIR Right     Family History  Problem Relation Age of Onset   Healthy Mother    Cancer Maternal Grandmother     Social History   Socioeconomic History   Marital status: Single    Spouse name: Not on file   Number of children: Not on file   Years of education: Not on file   Highest education level: Not on file  Occupational History   Not on file  Tobacco Use   Smoking status: Never   Smokeless tobacco: Never  Vaping Use   Vaping Use: Never used  Substance and Sexual Activity   Alcohol use: Yes    Comment: occasionally   Drug use: Never   Sexual activity: Yes  Other Topics Concern   Not on file  Social History Narrative   Not on file   Social Determinants of Health   Financial Resource Strain: Not on file  Food Insecurity: Not on file  Transportation Needs: Not on file  Physical Activity: Not on file  Stress: Not on file  Social Connections: Not on file  Intimate Partner Violence: Not on file    Outpatient Medications Prior to Visit  Medication Sig Dispense Refill   dextromethorphan-guaiFENesin (MUCINEX DM)  30-600 MG 12hr tablet Take 1 tablet by mouth 2 (two) times daily.     loratadine (CLARITIN) 10 MG tablet Take 10 mg by mouth daily.     ocrelizumab 300 mg in sodium chloride 0.9 % 250 mL Inject 300 mg into the vein once.     metroNIDAZOLE (METROGEL) 0.75 % vaginal gel Place 1 Applicatorful vaginally at bedtime. Apply one applicatorful to vagina at bedtime for 5 days 70 g 1   No facility-administered medications prior to visit.    No Known Allergies  Review of Systems     Objective:    Physical Exam Constitutional:      Appearance: She is well-developed.  HENT:     Head: Normocephalic and atraumatic.     Right Ear: Ear canal and external ear normal.     Left Ear: Tympanic membrane and external ear normal.     Ears:     Comments: Wax blocking the right TM    Nose: Nose normal.  Eyes:     Conjunctiva/sclera: Conjunctivae normal.     Pupils: Pupils are equal, round, and reactive to light.  Neck:     Thyroid: No thyromegaly.  Cardiovascular:     Rate and Rhythm: Normal rate and regular rhythm.  Heart sounds: Normal heart sounds.  Pulmonary:     Effort: Pulmonary effort is normal.     Breath sounds: Normal breath sounds. No wheezing.  Musculoskeletal:     Cervical back: Neck supple. No tenderness.  Lymphadenopathy:     Cervical: No cervical adenopathy.  Skin:    General: Skin is warm and dry.  Neurological:     Mental Status: She is alert and oriented to person, place, and time.    BP (!) 145/91    Pulse 85    Temp 98.7 F (37.1 C) (Oral)    SpO2 100%  Wt Readings from Last 3 Encounters:  03/23/21 187 lb (84.8 kg)  08/15/20 187 lb 14.4 oz (85.2 kg)  05/25/20 185 lb (83.9 kg)    Health Maintenance Due  Topic Date Due   COVID-19 Vaccine (3 - Pfizer risk series) 04/28/2020   PAP-Cervical Cytology Screening  08/19/2020   PAP SMEAR-Modifier  08/19/2020   INFLUENZA VACCINE  11/14/2020    There are no preventive care reminders to display for this patient.   No  results found for: TSH Lab Results  Component Value Date   WBC 7.8 08/15/2020   HGB 14.4 08/15/2020   HCT 43.1 08/15/2020   MCV 90.7 08/15/2020   PLT 448 (H) 08/15/2020   Lab Results  Component Value Date   NA 138 08/15/2020   K 4.2 08/15/2020   CO2 27 08/15/2020   GLUCOSE 97 08/15/2020   BUN 13 08/15/2020   CREATININE 0.89 08/15/2020   BILITOT 0.4 08/15/2020   AST 14 08/15/2020   ALT 12 08/15/2020   PROT 7.1 08/15/2020   CALCIUM 9.9 08/15/2020   Lab Results  Component Value Date   CHOL 237 (H) 08/15/2020   Lab Results  Component Value Date   HDL 74 08/15/2020   Lab Results  Component Value Date   LDLCALC 147 (H) 08/15/2020   Lab Results  Component Value Date   TRIG 63 08/15/2020   Lab Results  Component Value Date   CHOLHDL 3.2 08/15/2020   No results found for: HGBA1C     Assessment & Plan:   Problem List Items Addressed This Visit   None Visit Diagnoses     Viral upper respiratory tract infection    -  Primary   Relevant Medications   HYDROcodone bit-homatropine (HYCODAN) 5-1.5 MG/5ML syrup   benzonatate (TESSALON) 200 MG capsule   Acute cough       Relevant Medications   HYDROcodone bit-homatropine (HYCODAN) 5-1.5 MG/5ML syrup   benzonatate (TESSALON) 200 MG capsule      Most consistent with viral illness she just has a persistent cough for the last week and a half so we discussed really attacking the cough and try to get her more comfortable I really feel like she will continue to improve over the weekend but if not then please let us know on Monday.  Meds ordered this encounter  Medications   HYDROcodone bit-homatropine (HYCODAN) 5-1.5 MG/5ML syrup    Sig: Take 5-10 mLs by mouth at bedtime as needed for cough.    Dispense:  60 mL    Refill:  0   benzonatate (TESSALON) 200 MG capsule    Sig: Take 1 capsule (200 mg total) by mouth 2 (two) times daily as needed for cough.    Dispense:  20 capsule    Refill:  0     Beatrice Lecher,  MD

## 2021-05-09 ENCOUNTER — Telehealth: Payer: Self-pay | Admitting: Family Medicine

## 2021-05-09 DIAGNOSIS — R051 Acute cough: Secondary | ICD-10-CM

## 2021-05-09 DIAGNOSIS — J069 Acute upper respiratory infection, unspecified: Secondary | ICD-10-CM

## 2021-05-09 MED ORDER — HYDROCODONE BIT-HOMATROP MBR 5-1.5 MG/5ML PO SOLN: 5.0000 mL | Freq: Every evening | ORAL | 0 refills | Status: DC | PRN

## 2021-05-09 MED ORDER — AMOXICILLIN-POT CLAVULANATE 875-125 MG PO TABS: 1.0000 | ORAL_TABLET | Freq: Two times a day (BID) | ORAL | 0 refills | Status: DC

## 2021-05-09 NOTE — Telephone Encounter (Signed)
She is still not feeling well.  She is actually starting to feel worse with more congestion and raspy throat and the cough seems to be getting worse.  We will go ahead and refill cough medicine and place her on Augmentin.  Prescription sent to pharmacy.

## 2021-05-16 ENCOUNTER — Other Ambulatory Visit: Payer: Self-pay

## 2021-05-16 ENCOUNTER — Other Ambulatory Visit: Payer: Self-pay | Admitting: Family Medicine

## 2021-05-16 DIAGNOSIS — N760 Acute vaginitis: Secondary | ICD-10-CM | POA: Diagnosis not present

## 2021-05-16 LAB — WET PREP FOR TRICH, YEAST, CLUE
MICRO NUMBER:: 12943135
Specimen Quality: ADEQUATE

## 2021-05-16 MED ORDER — FLUCONAZOLE 150 MG PO TABS: 150.0000 mg | ORAL_TABLET | Freq: Once | ORAL | 0 refills | Status: AC

## 2021-05-16 NOTE — Progress Notes (Signed)
Meds ordered this encounter  Medications   fluconazole (DIFLUCAN) 150 MG tablet    Sig: Take 1 tablet (150 mg total) by mouth once for 1 dose.    Dispense:  2 tablet    Refill:  0    

## 2021-05-16 NOTE — Progress Notes (Signed)
Results shows yeast.  Will tx with diflucan

## 2021-06-15 DIAGNOSIS — G35 Multiple sclerosis: Secondary | ICD-10-CM | POA: Diagnosis not present

## 2021-06-29 DIAGNOSIS — G35 Multiple sclerosis: Secondary | ICD-10-CM | POA: Diagnosis not present

## 2021-08-16 ENCOUNTER — Other Ambulatory Visit: Payer: Self-pay | Admitting: Medical-Surgical

## 2021-08-16 DIAGNOSIS — Z0000 Encounter for general adult medical examination without abnormal findings: Secondary | ICD-10-CM

## 2021-08-17 ENCOUNTER — Ambulatory Visit (INDEPENDENT_AMBULATORY_CARE_PROVIDER_SITE_OTHER): Payer: 59 | Admitting: Medical-Surgical

## 2021-08-17 ENCOUNTER — Encounter: Payer: Self-pay | Admitting: Medical-Surgical

## 2021-08-17 VITALS — BP 139/91 | HR 68 | Resp 20 | Ht 62.0 in | Wt 190.5 lb

## 2021-08-17 DIAGNOSIS — Z0000 Encounter for general adult medical examination without abnormal findings: Secondary | ICD-10-CM | POA: Diagnosis not present

## 2021-08-17 LAB — CBC WITH DIFFERENTIAL/PLATELET
Absolute Monocytes: 457 {cells}/uL (ref 200–950)
Basophils Absolute: 28 {cells}/uL (ref 0–200)
Basophils Relative: 0.5 %
Eosinophils Absolute: 72 {cells}/uL (ref 15–500)
Eosinophils Relative: 1.3 %
HCT: 44 % (ref 35.0–45.0)
Hemoglobin: 14.2 g/dL (ref 11.7–15.5)
Lymphs Abs: 1485 {cells}/uL (ref 850–3900)
MCH: 29.1 pg (ref 27.0–33.0)
MCHC: 32.3 g/dL (ref 32.0–36.0)
MCV: 90.2 fL (ref 80.0–100.0)
MPV: 11.6 fL (ref 7.5–12.5)
Monocytes Relative: 8.3 %
Neutro Abs: 3460 {cells}/uL (ref 1500–7800)
Neutrophils Relative %: 62.9 %
Platelets: 416 10*3/uL — ABNORMAL HIGH (ref 140–400)
RBC: 4.88 Million/uL (ref 3.80–5.10)
RDW: 12.5 % (ref 11.0–15.0)
Total Lymphocyte: 27 %
WBC: 5.5 10*3/uL (ref 3.8–10.8)

## 2021-08-17 LAB — COMPLETE METABOLIC PANEL WITHOUT GFR
AG Ratio: 1.6 (calc) (ref 1.0–2.5)
ALT: 13 U/L (ref 6–29)
AST: 16 U/L (ref 10–30)
Albumin: 4.4 g/dL (ref 3.6–5.1)
Alkaline phosphatase (APISO): 76 U/L (ref 31–125)
BUN: 12 mg/dL (ref 7–25)
CO2: 24 mmol/L (ref 20–32)
Calcium: 9.5 mg/dL (ref 8.6–10.2)
Chloride: 102 mmol/L (ref 98–110)
Creat: 0.75 mg/dL (ref 0.50–0.97)
Globulin: 2.8 g/dL (ref 1.9–3.7)
Glucose, Bld: 67 mg/dL (ref 65–99)
Potassium: 4.5 mmol/L (ref 3.5–5.3)
Sodium: 136 mmol/L (ref 135–146)
Total Bilirubin: 0.4 mg/dL (ref 0.2–1.2)
Total Protein: 7.2 g/dL (ref 6.1–8.1)
eGFR: 110 mL/min/{1.73_m2}

## 2021-08-17 LAB — LIPID PANEL
Cholesterol: 246 mg/dL — ABNORMAL HIGH (ref <200)
HDL: 69 mg/dL (ref >=50)
LDL Cholesterol (Calc): 160 mg/dL (calc) — ABNORMAL HIGH
Non-HDL Cholesterol (Calc): 177 mg/dL (calc) — ABNORMAL HIGH (ref <130)
Total CHOL/HDL Ratio: 3.6 (calc) (ref <5.0)
Triglycerides: 70 mg/dL (ref <150)

## 2021-08-17 NOTE — Progress Notes (Signed)
HPI: ?Teresa Davila is a 31 y.o. female who  has a past medical history of Multiple sclerosis (HCC). ? ?she presents to [facility] today, 08/17/21,  ?for chief complaint of: ?Annual physical exam ? ?Dentist: UTD, every 6 months, no concerns ?Eye exam: None recently, no concerns, no correction ?Exercise: Room for improvement ?Diet: Room for improvement ?Pap smear: Sees OB/GYN ?COVID vaccine: Done, no booster ? ?Concerns: ?None ? ?Past medical, surgical, social and family history reviewed: ? ?Patient Active Problem List  ? Diagnosis Date Noted  ? Immunosuppression (HCC) 04/23/2019  ? 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 Patent foramen ovale 09/29/2015  ? Multiple sclerosis (HCC) 08/22/2015  ? ? ?Past Surgical History:  ?Procedure Laterality Date  ? ACHILLES TENDON REPAIR Right   ? ? ?Social History  ? ?Tobacco Use  ? Smoking status: Never  ? Smokeless tobacco: Never  ?Substance Use Topics  ? Alcohol use: Yes  ?  Comment: occasionally  ? ? ?Family History  ?Problem Relation Age of Onset  ? Healthy Mother   ? Cancer Maternal Grandmother   ?  ? ?Current medication list and allergy/intolerance information reviewed:   ? ?Current Outpatient Medications  ?Medication Sig Dispense Refill  ? loratadine (CLARITIN) 10 MG tablet Take 10 mg by mouth daily.    ? ocrelizumab 300 mg in sodium chloride 0.9 % 250 mL Inject 300 mg into the vein once.    ? ?No current facility-administered medications for this visit.  ? ? ?No Known Allergies ?  ? ?Review of Systems: ?Constitutional:  No  fever, no chills, No recent illness, No unintentional weight changes. No significant fatigue.  ?HEENT: No  headache, no vision change, no hearing change, No sore throat, No  sinus pressure ?Cardiac: No  chest pain, No  pressure, No palpitations, No  Orthopnea ?Respiratory:  No  shortness of breath. No  Cough ?Gastrointestinal: No  abdominal pain, No  nausea, No  vomiting,  No  blood in stool,  No  diarrhea, No  constipation  ?Musculoskeletal: No new myalgia/arthralgia ?Skin: No  Rash, No other wounds/concerning lesions ?Genitourinary: No  incontinence, No  abnormal genital bleeding, No abnormal genital discharge ?Hem/Onc: No  easy bruising/bleeding, No  abnormal lymph node ?Endocrine: No cold intolerance,  No heat intolerance. No polyuria/polydipsia/polyphagia  ?Neurologic: No  weakness, No  dizziness, No  slurred speech/focal weakness/facial droop ?Psychiatric: No  concerns with depression, No  concerns with anxiety, No sleep problems, No mood problems ? ?Exam:  ?BP (!) 139/91   Pulse 68   Resp 20   Ht 5\' 2"  (1.575 m)   Wt 190 lb 8 oz (86.4 kg)   SpO2 100%   BMI 34.84 kg/m?  ?Constitutional: VS see above. General Appearance: alert, well-developed, well-nourished, NAD ?Eyes: Normal lids and conjunctive, non-icteric sclera ?Ears, Nose, Mouth, Throat: MMM, Normal external inspection ears/nares/mouth/lips/gums. TM normal bilaterally. Pharynx/tonsils no erythema, no exudate. Nasal mucosa normal.  ?Neck: No masses, trachea midline. No thyroid enlargement. No tenderness/mass appreciated. No lymphadenopathy ?Respiratory: Normal respiratory effort. no wheeze, no rhonchi, no rales ?Cardiovascular: S1/S2 normal, no murmur, no rub/gallop auscultated. RRR. No lower extremity edema. No carotid bruit or JVD. No abdominal aortic bruit. ?Gastrointestinal: Nontender, no masses. No hepatomegaly, no splenomegaly. No hernia appreciated. Bowel sounds normal. Rectal exam deferred.  ?Musculoskeletal: Gait normal. No clubbing/cyanosis of digits.  ?Neurological: Normal balance/coordination. No tremor. No cranial nerve deficit on limited exam. Motor and sensation intact and symmetric. Cerebellar reflexes  intact.  ?Skin: warm, dry, intact. No rash/ulcer. No concerning nevi or subq nodules on limited exam.   ?Psychiatric: Normal judgment/insight. Normal mood and affect. Oriented x3.  ? ? ?ASSESSMENT/PLAN:  ? ?1. Annual  physical exam ?Lab results reviewed with patient from [facility] from yesterday.  Cholesterol is high so she will work on modifying her diet and exercise with a goal for weight loss.  Recommend regular eye exams.  Overdue for Pap smear on review of records so would like for her to get in with OB/GYN to have this done.  She is also welcome to have this done here if she would prefer. ? ? ?No orders of the defined types were placed in this encounter. ? ? ?No orders of the defined types were placed in this encounter. ? ? ?There are no Patient Instructions on file for this visit. ? ?Follow-up plan: Return in about 1 year (around 08/18/2022) for annual physical exam or sooner if needed. ? ?Thayer Ohm, DNP, APRN, FNP-BC ?[facility] ?Primary Care and Sports Medicine ?

## 2021-10-09 ENCOUNTER — Telehealth: Payer: Self-pay | Admitting: *Deleted

## 2021-11-13 ENCOUNTER — Encounter: Payer: Self-pay | Admitting: *Deleted

## 2021-11-14 ENCOUNTER — Other Ambulatory Visit (HOSPITAL_COMMUNITY)
Admission: RE | Admit: 2021-11-14 | Discharge: 2021-11-14 | Disposition: A | Discharge status: Home / Self Care | Payer: 59 | Source: Ambulatory Visit | Attending: Obstetrics and Gynecology | Admitting: Obstetrics and Gynecology

## 2021-11-14 ENCOUNTER — Ambulatory Visit (INDEPENDENT_AMBULATORY_CARE_PROVIDER_SITE_OTHER): Payer: 59 | Admitting: Obstetrics and Gynecology

## 2021-11-14 ENCOUNTER — Ambulatory Visit (INDEPENDENT_AMBULATORY_CARE_PROVIDER_SITE_OTHER): Payer: 59

## 2021-11-14 ENCOUNTER — Encounter: Payer: Self-pay | Admitting: Obstetrics and Gynecology

## 2021-11-14 DIAGNOSIS — Z3400 Encounter for supervision of normal first pregnancy, unspecified trimester: Secondary | ICD-10-CM | POA: Diagnosis not present

## 2021-11-14 DIAGNOSIS — Z3A09 9 weeks gestation of pregnancy: Secondary | ICD-10-CM

## 2021-11-14 DIAGNOSIS — Z3401 Encounter for supervision of normal first pregnancy, first trimester: Secondary | ICD-10-CM

## 2021-11-14 MED ORDER — ASPIRIN 81 MG PO CHEW: 81.0000 mg | CHEWABLE_TABLET | Freq: Every day | ORAL | 2 refills | Status: DC

## 2021-11-14 MED ORDER — BREAST PUMP MISC: 1.0000 | Freq: Every day | 0 refills | Status: DC

## 2021-11-14 NOTE — Progress Notes (Signed)
Last pap unsure

## 2021-11-14 NOTE — Progress Notes (Signed)
History:   Teresa Davila is a 31 y.o. G1P0000 at [redacted]w[redacted]d by LMP being seen today for her first obstetrical visit.  Her obstetrical history is significant for obesity and Multiple sclerosis (HCC). Patient does intend to breast feed. Pregnancy history fully reviewed. Partner is involved.  Denies History of HTN.   Was using Ocrelizumab for MS and stopped in February/March. Has Neurologist that she see's regularly. Next appointment is in Sept.   Patient reports no complaints.  HISTORY: OB History  Gravida Para Term Preterm AB Living  1 0 0 0 0 0  SAB IAB Ectopic Multiple Live Births  0 0 0 0 0    # Outcome Date GA Lbr Len/2nd Weight Sex Delivery Anes PTL Lv  1 Current             Last pap smear was done unsure of date, pap collected today.   Past Medical History:  Diagnosis Date   Multiple sclerosis (HCC)    Multiple sclerosis (HCC)    Vaginal Pap smear, abnormal    Past Surgical History:  Procedure Laterality Date   ACHILLES TENDON REPAIR Right    ACHILLES TENDON SURGERY Right    WISDOM TOOTH EXTRACTION     Family History  Problem Relation Age of Onset   Healthy Mother    Cancer Maternal Grandmother    Social History   Tobacco Use   Smoking status: Never   Smokeless tobacco: Never  Vaping Use   Vaping Use: Never used  Substance Use Topics   Alcohol use: Yes    Comment: occasionally   Drug use: Never   No Known Allergies Current Outpatient Medications on File Prior to Visit  Medication Sig Dispense Refill   cholecalciferol (VITAMIN D3) 25 MCG (1000 UNIT) tablet Take 2,000 Units by mouth daily.     loratadine (CLARITIN) 10 MG tablet Take 10 mg by mouth daily.     Prenatal Vit-Fe Fumarate-FA (PRENATAL VITAMIN PO) Take by mouth.     ocrelizumab 300 mg in sodium chloride 0.9 % 250 mL Inject 300 mg into the vein once. (Patient not taking: Reported on 11/14/2021)     No current facility-administered medications on file prior to visit.    Review of Systems Pertinent  items noted in HPI and remainder of comprehensive ROS otherwise negative.  Indications for ASA therapy (per UpToDate) One of the following: Previous pregnancy with preeclampsia, especially early onset and with an adverse outcome No Multifetal gestation No Chronic hypertension No Type 1 or 2 diabetes mellitus No Chronic kidney disease No Autoimmune disease (antiphospholipid syndrome, systemic lupus erythematosus) Yes Two or more of the following: Nulliparity Yes Obesity (body mass index >30 kg/m2) Yes Family history of preeclampsia in mother or sister No Age ?35 years No Sociodemographic characteristics (African American race, low socioeconomic level) Yes Personal risk factors (eg, previous pregnancy with low birth weight or small for gestational age infant, previous adverse pregnancy outcome [eg, stillbirth], interval >10 years between pregnancies) No  Physical Exam:   Vitals:   11/14/21 0848  BP: 138/87  Pulse: 86  Weight: 194 lb (88 kg)   BP 138/87   Pulse 86   Wt 194 lb (88 kg)   LMP 09/09/2021   BMI 35.48 kg/m  Uterine Size: size equals dates  Pelvic Exam:    Perineum: No Hemorrhoids, Normal Perineum   Vulva: normal   Vagina:  normal mucosa, normal discharge, no palpable nodules   pH: Not done   Cervix: no bleeding  following Pap, no cervical motion tenderness and no lesions   Adnexa: normal adnexa and no mass, fullness, tenderness   Bony Pelvis: Adequate  System: Breast:  No nipple retraction or dimpling, No nipple discharge or bleeding, No axillary or supraclavicular adenopathy, Normal to palpation without dominant masses   Skin: normal coloration and turgor, no rashes    Neurologic: negative   Extremities: normal strength, tone, and muscle mass   HEENT neck supple with midline trachea and thyroid without masses   Mouth/Teeth mucous membranes moist, pharynx normal without lesions   Neck supple and no masses   Cardiovascular: regular rate and rhythm, no murmurs or  gallops   Respiratory:  appears well, vitals normal, no respiratory distress, acyanotic, normal RR, neck free of mass or lymphadenopathy, chest clear, no wheezing, crepitations, rhonchi, normal symmetric air entry   Abdomen: soft, non-tender; bowel sounds normal; no masses,  no organomegaly   Urinary: urethral meatus normal       Assessment:    Pregnancy: G1P0000 Patient Active Problem List   Diagnosis Date Noted   Supervision of normal first pregnancy 11/14/2021   Immunosuppression (HCC) 04/23/2019   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 Supervision of normal first pregnancy, antepartum  - US OB Limited; Future - Culture, OB Urine - Cytology - PAP - Hepatitis C antibody - PANORAMA PRENATAL TEST FULL PANEL - Obstetric panel - HORIZON 4 (SMA, CF, FRAGILE X, DMD) - HIV Antibody (routine testing w rflx) - Vitamin D (25 hydroxy) - HgB A1c - AMB referral to maternal fetal medicine  - RX: BASA   Initial labs drawn. Welcomed patient to practice.  Continue prenatal vitamins. Problem list reviewed and updated. Genetic Screening discussed, Panorama and Horizon: requested. Ultrasound discussed; fetal anatomic survey: planned. Anticipatory guidance about prenatal visits given including labs, ultrasounds, and testing. Weight gain recommendations per IOM guidelines reviewed: underweight/BMI 18.5 or less > 28 - 40 lbs; normal weight/BMI 18.5 - 24.9 > 25 - 35 lbs; overweight/BMI 25 - 29.9 > 15 - 25 lbs; obese/BMI  30 or more > 11 - 20 lbs. Discussed usage of the Babyscripts app for more information about pregnancy, and to track blood pressures. Also discussed usage of virtual visits as additional source of managing and completing prenatal visits.  Patient was encouraged to use MyChart to review results, send requests, and have questions addressed.   The nature of [place] [facility]  Center for [facility]Faculty Practice with multiple MDs and Advanced Practice Providers was explained to patient; also emphasized that residents, students are part of our team. Routine obstetric precautions reviewed. Encouraged to seek out care at office or emergency room [facility] preferred) for urgent and/or emergent concerns.    Janin Kozlowski, Harolyn Rutherford, NP  Faculty Practice Center for [employer], [facility]

## 2021-11-14 NOTE — Progress Notes (Signed)
Bedside U/S shows a single IUP with FHT of 183 BPM and CRL is consistent with todays U/S.  U/S sent to AS

## 2021-11-15 LAB — CYTOLOGY - PAP
Chlamydia: NEGATIVE
Comment: NEGATIVE
Comment: NEGATIVE
Comment: NORMAL
Diagnosis: NEGATIVE
High risk HPV: NEGATIVE
Neisseria Gonorrhea: NEGATIVE

## 2021-11-16 DIAGNOSIS — Z3482 Encounter for supervision of other normal pregnancy, second trimester: Secondary | ICD-10-CM | POA: Diagnosis not present

## 2021-11-16 DIAGNOSIS — Z3483 Encounter for supervision of other normal pregnancy, third trimester: Secondary | ICD-10-CM | POA: Diagnosis not present

## 2021-11-16 LAB — OBSTETRIC PANEL
Absolute Monocytes: 605 cells/uL (ref 200–950)
Antibody Screen: NOT DETECTED
Basophils Absolute: 37 cells/uL (ref 0–200)
Basophils Relative: 0.4 %
Eosinophils Absolute: 19 cells/uL (ref 15–500)
Eosinophils Relative: 0.2 %
HCT: 42.3 % (ref 35.0–45.0)
Hemoglobin: 13.9 g/dL (ref 11.7–15.5)
Hepatitis B Surface Ag: NONREACTIVE
Lymphs Abs: 1544 cells/uL (ref 850–3900)
MCH: 29.6 pg (ref 27.0–33.0)
MCHC: 32.9 g/dL (ref 32.0–36.0)
MCV: 90.2 fL (ref 80.0–100.0)
MPV: 12.1 fL (ref 7.5–12.5)
Monocytes Relative: 6.5 %
Neutro Abs: 7096 cells/uL (ref 1500–7800)
Neutrophils Relative %: 76.3 %
Platelets: 428 10*3/uL — ABNORMAL HIGH (ref 140–400)
RBC: 4.69 10*6/uL (ref 3.80–5.10)
RDW: 12.4 % (ref 11.0–15.0)
RPR Ser Ql: NONREACTIVE
Rubella: 3.55 Index
Total Lymphocyte: 16.6 %
WBC: 9.3 10*3/uL (ref 3.8–10.8)

## 2021-11-16 LAB — URINE CULTURE, OB REFLEX

## 2021-11-16 LAB — HEMOGLOBIN A1C
Hgb A1c MFr Bld: 4.9 % of total Hgb (ref <5.7)
Mean Plasma Glucose: 94 mg/dL
eAG (mmol/L): 5.2 mmol/L

## 2021-11-16 LAB — VITAMIN D 25 HYDROXY (VIT D DEFICIENCY, FRACTURES): Vit D, 25-Hydroxy: 35 ng/mL (ref 30–100)

## 2021-11-16 LAB — CULTURE, OB URINE

## 2021-11-16 LAB — HEPATITIS C ANTIBODY: Hepatitis C Ab: NONREACTIVE

## 2021-11-16 LAB — HIV ANTIBODY (ROUTINE TESTING W REFLEX): HIV 1&2 Ab, 4th Generation: NONREACTIVE

## 2021-11-21 ENCOUNTER — Encounter: Payer: 59 | Admitting: Obstetrics and Gynecology

## 2021-12-02 DIAGNOSIS — Z3400 Encounter for supervision of normal first pregnancy, unspecified trimester: Secondary | ICD-10-CM | POA: Diagnosis not present

## 2021-12-09 LAB — PANORAMA PRENATAL TEST FULL PANEL:PANORAMA TEST PLUS 5 ADDITIONAL MICRODELETIONS: FETAL FRACTION: 3.6

## 2021-12-12 ENCOUNTER — Encounter: Payer: 59 | Admitting: Obstetrics and Gynecology

## 2021-12-12 LAB — HORIZON 4 (SMA, CF, FRAGILE X, DMD)
CYSTIC FIBROSIS: NEGATIVE
DUCHENNE/BECKER MUSCULAR DYSTROPHY: NEGATIVE
FRAGILE X SYNDROME: NEGATIVE
REPORT SUMMARY: POSITIVE — AB
SPINAL MUSCULAR ATROPHY: POSITIVE — AB

## 2021-12-21 ENCOUNTER — Encounter: Payer: 59 | Admitting: Obstetrics and Gynecology

## 2021-12-21 DIAGNOSIS — Z331 Pregnant state, incidental: Secondary | ICD-10-CM | POA: Diagnosis not present

## 2021-12-21 DIAGNOSIS — G35 Multiple sclerosis: Secondary | ICD-10-CM | POA: Diagnosis not present

## 2021-12-26 ENCOUNTER — Ambulatory Visit (INDEPENDENT_AMBULATORY_CARE_PROVIDER_SITE_OTHER): Payer: 59 | Admitting: Obstetrics and Gynecology

## 2021-12-26 VITALS — BP 131/90 | HR 85 | Wt 197.0 lb

## 2021-12-26 DIAGNOSIS — Z3A15 15 weeks gestation of pregnancy: Secondary | ICD-10-CM

## 2021-12-26 DIAGNOSIS — Z3400 Encounter for supervision of normal first pregnancy, unspecified trimester: Secondary | ICD-10-CM

## 2021-12-26 DIAGNOSIS — Z3402 Encounter for supervision of normal first pregnancy, second trimester: Secondary | ICD-10-CM

## 2021-12-26 DIAGNOSIS — R030 Elevated blood-pressure reading, without diagnosis of hypertension: Secondary | ICD-10-CM | POA: Insufficient documentation

## 2021-12-26 NOTE — Progress Notes (Signed)
   PRENATAL VISIT NOTE  Subjective:  Teresa Davila is a 31 y.o. G1P0000 at [redacted]w[redacted]d being seen today for ongoing prenatal care.  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  Patient reports no complaints.  Contractions: Not present. Vag. Bleeding: None.  Movement: Absent. Denies leaking of fluid.   The following portions of the patient's history were reviewed and updated as appropriate: allergies, current medications, past family history, past medical history, past social history, past surgical history and problem list.   Objective:   Vitals:   12/26/21 1540  BP: (!) 131/90  Pulse: 85  Weight: 197 lb (89.4 kg)    Fetal Status: Fetal Heart Rate (bpm): 154   Movement: Absent     General:  Alert, oriented and cooperative. Patient is in no acute distress.  Skin: Skin is warm and dry. No rash noted.   Cardiovascular: Normal heart rate noted  Respiratory: Normal respiratory effort, no problems with respiration noted  Abdomen: Soft, gravid, appropriate for gestational age.  Pain/Pressure: Absent     Pelvic: Cervical exam deferred        Extremities: Normal range of motion.  Edema: None  Mental Status: Normal mood and affect. Normal behavior. Normal judgment and thought content.   Assessment and Plan:  Pregnancy: G1P0000 at [redacted]w[redacted]d  BP today in the office 131/90 BP readings at home: 116/82 @ 1700 127/87 @ 0700 120/82 @ 1800 She will bring her BP cuff to her next visit to compare with our cuff Low threshold for initiating Nifedipine  She Is taking Ocrelizumab, last dose was March, canceled injection this month. Will talk with MFM and get their input.  We discussed that there is not a lot of data to support safety of Ocrelizumab in pregnancy; her neurologist told  her this as well She plans to have MRI prior to delivery per Neurology.  Continue BASA  Preterm labor symptoms and general obstetric precautions including but not limited to vaginal bleeding, contractions, leaking of fluid and fetal movement were reviewed in detail with the patient. Please refer to After Visit Summary for other counseling recommendations.   Return in about 4 weeks (around 01/23/2022).  Future Appointments  Date Time Provider Department Center  01/10/2022 10:30 AM [facility] NURSE [facility]  01/10/2022 10:45 AM [facility] US4 [facility]  01/10/2022 12:00 PM [facility] MD [facility]  01/23/2022  3:30 PM Brand Males, CNM [facility]  08/20/2022 10:50 AM Christen Butter, NP [facility] None    Venia Carbon, NP

## 2022-01-01 ENCOUNTER — Ambulatory Visit (INDEPENDENT_AMBULATORY_CARE_PROVIDER_SITE_OTHER): Payer: 59 | Admitting: Physician Assistant

## 2022-01-01 ENCOUNTER — Encounter: Payer: Self-pay | Admitting: Physician Assistant

## 2022-01-01 VITALS — BP 132/92 | HR 84 | Ht 62.0 in | Wt 193.0 lb

## 2022-01-01 DIAGNOSIS — Z3A16 16 weeks gestation of pregnancy: Secondary | ICD-10-CM | POA: Diagnosis not present

## 2022-01-01 DIAGNOSIS — N898 Other specified noninflammatory disorders of vagina: Secondary | ICD-10-CM

## 2022-01-01 DIAGNOSIS — R102 Pelvic and perineal pain: Secondary | ICD-10-CM

## 2022-01-01 LAB — POCT URINALYSIS DIP (CLINITEK)
Bilirubin, UA: NEGATIVE
Glucose, UA: NEGATIVE mg/dL
Ketones, POC UA: NEGATIVE mg/dL
Nitrite, UA: NEGATIVE
POC PROTEIN,UA: NEGATIVE
Spec Grav, UA: 1.02 (ref 1.010–1.025)
Urobilinogen, UA: 1 E.U./dL
pH, UA: 7.5 (ref 5.0–8.0)

## 2022-01-01 MED ORDER — MICONAZOLE 3 200 MG VA SUPP: 200.0000 mg | Freq: Every day | VAGINAL | 0 refills | Status: DC

## 2022-01-01 NOTE — Progress Notes (Signed)
Acute Office Visit  Subjective:     Patient ID: Teresa Davila, female    DOB: 21-Mar-1991, 31 y.o.   MRN: [id number]  Chief Complaint  Patient presents with   Vaginal Discharge    HPI Pt is a [redacted] week pregnant 31 y/o F presenting today for possible yeast infection. She complains of vaginal itchiness and vaginal discharge that is clear and liquid. Denies white or clumpy discharge. Denies dysuria, back pain, but reports some suprapubic tenderness and urinary frequency due to her pregnancy. Pt has had 1 STI in the past a long time ago, but has 1 partner and uses protection so she is not concerned for an STI at this time. Denies any fever, chills, body aches, or flank pain.   .. Active Ambulatory Problems    Diagnosis Date Noted   Immunosuppression ([place]) 04/23/2019   Multiple sclerosis ([place]) 08/22/2015   Optic neuritis due to multiple sclerosis ([place]) 09/28/2016   Patent foramen ovale 09/29/2015   Supervision of normal first pregnancy 11/14/2021   Elevated BP without diagnosis of hypertension 12/26/2021   Resolved Ambulatory Problems    Diagnosis Date Noted   No Resolved Ambulatory Problems   Past Medical History:  Diagnosis Date   Vaginal Pap smear, abnormal      ROS See HPI     Objective:    BP (!) 132/92   Pulse 84   Ht 5\' 2"  (1.575 m)   Wt 193 lb (87.5 kg)   LMP 09/09/2021   SpO2 100%   BMI 35.30 kg/m  BP Readings from Last 3 Encounters:  01/01/22 (!) 132/92  12/26/21 (!) 131/90  11/14/21 138/87   Wt Readings from Last 3 Encounters:  01/01/22 193 lb (87.5 kg)  12/26/21 197 lb (89.4 kg)  11/14/21 194 lb (88 kg)      Physical Exam Cardiovascular:     Rate and Rhythm: Normal rate.  Pulmonary:     Effort: Pulmonary effort is normal.  Abdominal:     General: There is no distension.     Palpations: Abdomen is soft. There is no mass.     Tenderness: There is no abdominal tenderness. There is no right CVA tenderness, left CVA tenderness, guarding or  rebound.  Neurological:     Mental Status: She is alert.  Psychiatric:        Mood and Affect: Mood normal.     .. Results for orders placed or performed in visit on 01/01/22  POCT URINALYSIS DIP (CLINITEK)  Result Value Ref Range   Color, UA yellow yellow   Clarity, UA clear clear   Glucose, UA negative negative mg/dL   Bilirubin, UA negative negative   Ketones, POC UA negative negative mg/dL   Spec Grav, UA 1.020 1.010 - 1.025   Blood, UA moderate (A) negative   pH, UA 7.5 5.0 - 8.0   POC PROTEIN,UA negative negative, trace   Urobilinogen, UA 1.0 0.2 or 1.0 E.U./dL   Nitrite, UA Negative Negative   Leukocytes, UA Trace (A) Negative         Assessment & Plan:  Marland KitchenMarland KitchenGalin was seen today for vaginal discharge.  Diagnoses and all orders for this visit:  Vaginal itching -     miconazole (MICONAZOLE 3) 200 MG vaginal suppository; Place 1 suppository (200 mg total) vaginally at bedtime. -     WET PREP FOR TRICH, YEAST, CLUE -     Urine Culture -     POCT URINALYSIS DIP (CLINITEK)  [redacted]  weeks gestation of pregnancy  Suprapubic pressure   Likely yeast vaginitis Will empirically  treat with miconazole for 3 day due to pregnancy will avoid diflucan Wet prep sent off UA done today in office and positive for blood and leuks Will hold on abx treatment due to no real UTI symptoms Increase hydration  Follow up if symptoms persist, change, or worsen.

## 2022-01-02 LAB — WET PREP FOR TRICH, YEAST, CLUE
MICRO NUMBER:: 13936622
Specimen Quality: ADEQUATE

## 2022-01-02 NOTE — Progress Notes (Signed)
Confirmed yeast. We like to avoid oral diflucan in pregnant women. I sent suppositories yesterday. Use those and then lets see how you are doing.

## 2022-01-03 ENCOUNTER — Other Ambulatory Visit: Payer: Self-pay | Admitting: Neurology

## 2022-01-03 DIAGNOSIS — R102 Pelvic and perineal pain: Secondary | ICD-10-CM | POA: Diagnosis not present

## 2022-01-03 LAB — URINE CULTURE
MICRO NUMBER:: 13936624
SPECIMEN QUALITY:: ADEQUATE

## 2022-01-03 NOTE — Progress Notes (Signed)
Very very low colony count of gram positive bacteria. Usually we would not treat but if still symptomatic would consider due to pregnancy treating this as well. How are you feeling?

## 2022-01-05 ENCOUNTER — Other Ambulatory Visit: Payer: Self-pay | Admitting: Physician Assistant

## 2022-01-05 DIAGNOSIS — N898 Other specified noninflammatory disorders of vagina: Secondary | ICD-10-CM

## 2022-01-05 LAB — URINE CULTURE
MICRO NUMBER:: 13948962
SPECIMEN QUALITY:: ADEQUATE

## 2022-01-05 MED ORDER — MICONAZOLE 3 200 MG VA SUPP: 200.0000 mg | Freq: Every day | VAGINAL | 0 refills | Status: DC

## 2022-01-05 MED ORDER — AMOXICILLIN-POT CLAVULANATE 500-125 MG PO TABS: 1.0000 | ORAL_TABLET | Freq: Two times a day (BID) | ORAL | 0 refills | Status: DC

## 2022-01-05 NOTE — Progress Notes (Signed)
Gram positive not worsening but still present. Sent 7 days of augmentin to pharmacy.

## 2022-01-05 NOTE — Addendum Note (Signed)
Addended byAnnamaria Helling on: 01/05/2022 08:40 AM   Modules accepted: Orders

## 2022-01-08 ENCOUNTER — Telehealth: Payer: Self-pay | Admitting: Neurology

## 2022-01-08 MED ORDER — AMOXICILLIN-POT CLAVULANATE NICU ORAL SYRINGE 400-57 MG/5 ML: 800.0000 mg | Freq: Two times a day (BID) | ORAL | 0 refills | Status: DC

## 2022-01-08 MED ORDER — CEPHALEXIN 250 MG/5ML PO SUSR: 500.0000 mg | Freq: Two times a day (BID) | ORAL | 0 refills | Status: DC

## 2022-01-08 NOTE — Telephone Encounter (Signed)
Also had nausea with Augmentin

## 2022-01-08 NOTE — Telephone Encounter (Signed)
Patient made aware.

## 2022-01-08 NOTE — Addendum Note (Signed)
Addended by: Donella Stade on: 01/08/2022 03:27 PM   Modules accepted: Orders

## 2022-01-08 NOTE — Telephone Encounter (Signed)
Patient started Augmentin, took two doses and was very sick. She is asking for alternative. She is not doing well with pills during pregnancy. She is asking if liquid is an option. Please advise.

## 2022-01-09 MED ORDER — CEPHALEXIN 250 MG/5ML PO SUSR: 500.0000 mg | Freq: Two times a day (BID) | ORAL | 0 refills | Status: DC

## 2022-01-09 NOTE — Addendum Note (Signed)
Addended byAnnamaria Helling on: 01/09/2022 01:07 PM   Modules accepted: Orders

## 2022-01-09 NOTE — Telephone Encounter (Signed)
RX on backorder at [facility]. Sent to [facility]. Patient aware. It is available at this pharmacy.

## 2022-01-10 ENCOUNTER — Other Ambulatory Visit: Payer: Self-pay | Admitting: *Deleted

## 2022-01-10 ENCOUNTER — Encounter: Payer: Self-pay | Admitting: *Deleted

## 2022-01-10 ENCOUNTER — Ambulatory Visit: Payer: 59 | Attending: Obstetrics and Gynecology

## 2022-01-10 ENCOUNTER — Ambulatory Visit (HOSPITAL_BASED_OUTPATIENT_CLINIC_OR_DEPARTMENT_OTHER): Payer: 59 | Admitting: Maternal & Fetal Medicine

## 2022-01-10 ENCOUNTER — Ambulatory Visit: Payer: 59 | Admitting: *Deleted

## 2022-01-10 VITALS — BP 127/82 | HR 92

## 2022-01-10 DIAGNOSIS — G35 Multiple sclerosis: Secondary | ICD-10-CM

## 2022-01-10 DIAGNOSIS — O99212 Obesity complicating pregnancy, second trimester: Secondary | ICD-10-CM | POA: Diagnosis not present

## 2022-01-10 DIAGNOSIS — Z362 Encounter for other antenatal screening follow-up: Secondary | ICD-10-CM

## 2022-01-10 DIAGNOSIS — Z3400 Encounter for supervision of normal first pregnancy, unspecified trimester: Secondary | ICD-10-CM | POA: Insufficient documentation

## 2022-01-10 DIAGNOSIS — Z3689 Encounter for other specified antenatal screening: Secondary | ICD-10-CM | POA: Insufficient documentation

## 2022-01-10 DIAGNOSIS — Z3A17 17 weeks gestation of pregnancy: Secondary | ICD-10-CM | POA: Diagnosis not present

## 2022-01-10 DIAGNOSIS — O99352 Diseases of the nervous system complicating pregnancy, second trimester: Secondary | ICD-10-CM | POA: Insufficient documentation

## 2022-01-10 NOTE — Progress Notes (Signed)
MFM consultation  Teresa Davila is a 31 yo G1P0 who is seen at 17w4 d at the request of Samuel Bouche, NP due a history of Multiple sclerosis.  She is doing well without complaints. 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  Her pregnancy is complicated by Multiple Sclerosis.  She has been diagnosed since 2017. She presented with vision loss and temporary leg weakness. She had and MRI with demyelinating lesions suggestive of MS. She was being treated with Ocrevus (OCRELIZUMAB) but was discontinued at the beginning of pregnancy given uncertain pregnancy related complications.   She is under the care of a Neurologist and is overall doing well. She denies any complaints or s/sx of MS relapse.     01/10/2022   10:13 AM 01/01/2022    8:16 AM 12/26/2021    3:40 PM  Vitals with BMI  Height  5\' 2"    Weight  193 lbs 197 lbs  BMI  0000000   Systolic [id number]  Diastolic 82 92 90  Pulse 92 84 85      Latest Ref Rng & Units 11/14/2021   12:00 AM 08/16/2021   12:00 AM 08/15/2020   12:00 AM  CBC  WBC 3.8 - 10.8 Thousand/uL 9.3  5.5  7.8   Hemoglobin 11.7 - 15.5 g/dL 13.9  14.2  14.4   Hematocrit 35.0 - 45.0 % 42.3  44.0  43.1   Platelets 140 - 400 Thousand/uL 428  416  448       Latest Ref Rng & Units 08/16/2021   12:00 AM 08/15/2020   12:00 AM  CMP  Glucose 65 - 99 mg/dL 67  97   BUN 7 - 25 mg/dL 12  13   Creatinine 0.50 - 0.97 mg/dL 0.75  0.89   Sodium 135 - 146 mmol/L 136  138   Potassium 3.5 - 5.3 mmol/L 4.5  4.2   Chloride 98 - 110 mmol/L 102  103   CO2 20 - 32 mmol/L 24  27   Calcium 8.6 - 10.2 mg/dL 9.5  9.9   Total Protein 6.1 - 8.1 g/dL 7.2  7.1   Total Bilirubin 0.2 - 1.2 mg/dL 0.4  0.4   AST 10 - 30 U/L 16  14   ALT 6 - 29 U/L 13  12    Past Medical History:  Diagnosis Date   Multiple sclerosis (HCC)    Multiple sclerosis (HCC)    Vaginal Pap smear, abnormal    Past Surgical History:  Procedure Laterality Date   ACHILLES TENDON REPAIR Right    ACHILLES TENDON SURGERY  Right    WISDOM TOOTH EXTRACTION     Family History  Problem Relation Age of Onset   Healthy Mother    Cancer Maternal Grandmother    OB History  Gravida Para Term Preterm AB Living  1 0 0 0 0 0  SAB IAB Ectopic Multiple Live Births  0 0 0 0 0    # Outcome Date GA Lbr Len/2nd Weight Sex Delivery Anes PTL Lv  1 Current            Social History   Socioeconomic History   Marital status: Single    Spouse name: Not on file   Number of children: Not on file   Years of education: Not on file   Highest education level: Not on file  Occupational History   Not on file  Tobacco Use   Smoking status: Never  Smokeless tobacco: Never  Vaping Use   Vaping Use: Never used  Substance and Sexual Activity   Alcohol use: Yes    Comment: occasionally   Drug use: Never   Sexual activity: Yes    Birth control/protection: None  Other Topics Concern   Not on file  Social History Narrative   Not on file   Social Determinants of Health   Financial Resource Strain: Not on file  Food Insecurity: Not on file  Transportation Needs: Not on file  Physical Activity: Not on file  Stress: Not on file  Social Connections: Not on file  Intimate Partner Violence: Not on file          Current Outpatient Medications (Analgesics):    aspirin 81 MG chewable tablet, Chew 1 tablet (81 mg total) by mouth daily.   Current Outpatient Medications (Other):    cephALEXin (KEFLEX) 250 MG/5ML suspension, Take 10 mLs (500 mg total) by mouth 2 (two) times daily. For 5 days.   cholecalciferol (VITAMIN D3) 25 MCG (1000 UNIT) tablet, Take 2,000 Units by mouth daily. (Patient not taking: Reported on 12/26/2021)   Prenatal Vit-Fe Fumarate-FA (PRENATAL VITAMIN PO), Take by mouth.  No Known Allergies  Imaging: A single intrauterine pregnancy was observed with measurements consistent with dates.  A detailed anatomy was performed and there was no markers of aneuploidy.  There is good fetal movement and  amniotic fluid volume  Suboptimal views of the fetal anatomy was observed secondary to fetal position.   Impression/Counseling:  Multiple sclerosis (MS) is a demyelinating disease that affects the central nervous system at different levels and at varying times.   It is a relatively common neurologic disease among young adults, peaking at age 11. The prevalence in the [place] is 1 per 1000. Women are affected twice as often as men. Previous research on the effects of pregnancy on MS has generally been flawed, and well-controlled studies are still needed.   Pregnancy itself may exert a short-term beneficial effect on the course of MS, including fewer, less severe relapses, especially in the third trimester. However, this protection is lost in the postpartum period. The incidence of new cases of MS is decreased during pregnancy, as is the risk of exacerbation and progression of existing disease.    Postpartum, the incidence of new onset disease is not different from that of the non-pregnant population. Exacerbation is reported to increase 20-40% during the first 6 months after delivery. Despite the increase in disease activity postpartum, there does not appear to be an increase in long-term disability related to pregnancy.   The relative immunosuppression associated with pregnancy may play a role in pregnancy related changes seen in MS. Children of MS mothers have a 3% risk of developing MS compared with a 0.1% risk seen in the general population.   In general, patients should be monitored for evidence of increased disease activity and the risks of therapy weighed against the potential concerns associated with lack of information. In those patients with urinary tract involvement, regular screening for asymptomatic bacteruria should take place.   If the patient is on prolonged corticosteroids, then stress dose steroids are recommended during labor. Additionally, the use of spinal, epidural and  general anesthesia can all be used safely.   Breast-feeding may be encouraged, because there does not appear to be an increase in the frequency or severity of postpartum relapse.   Lastly, I discussed the effects of OCRELIZUMAB, the primary observation included in registries include reduced levels  of B-lymphocytes. There are some reports of fetal growth delays.  Given the above I recommend growth exams at 28 and 32 weeks. Antenatal testing is reserved for fetal growth delays or other conditions that increase fetal stillbirth risk.   I spent 45 minutes with > 50% in face to face consultation.  Vikki Ports, MD

## 2022-01-15 ENCOUNTER — Other Ambulatory Visit: Payer: Self-pay | Admitting: Neurology

## 2022-01-15 DIAGNOSIS — R102 Pelvic and perineal pain: Secondary | ICD-10-CM

## 2022-01-15 NOTE — Progress Notes (Signed)
Patient completed antibiotics, needs to check for clearance.

## 2022-01-17 LAB — URINE CULTURE
MICRO NUMBER:: 13997439
Result:: NO GROWTH
SPECIMEN QUALITY:: ADEQUATE

## 2022-01-17 NOTE — Progress Notes (Signed)
No bacteria growth on urine. How are you feeling?

## 2022-01-23 ENCOUNTER — Ambulatory Visit (INDEPENDENT_AMBULATORY_CARE_PROVIDER_SITE_OTHER): Payer: 59

## 2022-01-23 VITALS — BP 132/84 | HR 77 | Wt 199.0 lb

## 2022-01-23 DIAGNOSIS — Z3400 Encounter for supervision of normal first pregnancy, unspecified trimester: Secondary | ICD-10-CM

## 2022-01-23 DIAGNOSIS — Z3402 Encounter for supervision of normal first pregnancy, second trimester: Secondary | ICD-10-CM

## 2022-01-23 DIAGNOSIS — Z3A19 19 weeks gestation of pregnancy: Secondary | ICD-10-CM

## 2022-01-23 NOTE — Progress Notes (Signed)
   PRENATAL VISIT NOTE  Subjective:  Teresa Davila is a 31 y.o. G1P0000 at [redacted]w[redacted]d being seen today for ongoing prenatal care.  She is currently monitored for the following issues for this high-risk pregnancy and has Immunosuppression ([place]); Multiple sclerosis ([place]); Optic neuritis due to multiple sclerosis ([place]); Patent foramen ovale; Supervision of normal first pregnancy; and Elevated BP without diagnosis of hypertension on their problem list.  Patient reports no complaints.  Contractions: Not present. Vag. Bleeding: None.  Movement: Absent. Denies leaking of fluid.   The following portions of the patient's history were reviewed and updated as appropriate: allergies, current medications, past family history, past medical history, past social history, past surgical history and problem list.   Objective:   Vitals:   01/23/22 1529 01/23/22 1532  BP: (!) 156/95 132/84  Pulse: 85 77  Weight: 199 lb (90.3 kg)     Fetal Status: Fetal Heart Rate (bpm): 151   Movement: Absent     General:  Alert, oriented and cooperative. Patient is in no acute distress.  Skin: Skin is warm and dry. No rash noted.   Cardiovascular: Normal heart rate noted  Respiratory: Normal respiratory effort, no problems with respiration noted  Abdomen: Soft, gravid, appropriate for gestational age.  Pain/Pressure: Absent     Pelvic: Cervical exam deferred        Extremities: Normal range of motion.  Edema: None  Mental Status: Normal mood and affect. Normal behavior. Normal judgment and thought content.   Assessment and Plan:  Pregnancy: G1P0000 at [redacted]w[redacted]d 1. Supervision of normal first pregnancy, antepartum - Routine OB. Doing well, no concerns - Patient brought BP cuff to compare to office cuff. Initial BP on office cuff elevated at 156/95. Repeat BP on both patient and office cuff normotensive. Patient asymptomatic. BP at MFM normotensive. Watch BP's carefully. Consider treatment prn. - AFP today  - Alpha  fetoprotein, maternal  2. [redacted] weeks gestation of pregnancy   Preterm labor symptoms and general obstetric precautions including but not limited to vaginal bleeding, contractions, leaking of fluid and fetal movement were reviewed in detail with the patient. Please refer to After Visit Summary for other counseling recommendations.   Return in about 4 weeks (around 02/20/2022) for [facility].  Future Appointments  Date Time Provider [place]  02/20/2022  3:50 PM  Sink [facility]  03/23/2022  3:30 PM [facility] NURSE [facility]  03/23/2022  3:45 PM [facility] US5 [facility]  08/20/2022 10:50 AM Samuel Bouche, NP [facility] None    Renee Harder, CNM

## 2022-01-23 NOTE — Progress Notes (Signed)
BP on office cuff 156/95. BP on home cuff in office today 129/94. Repeat BP on office cuff 132/84.0

## 2022-01-24 DIAGNOSIS — Z3400 Encounter for supervision of normal first pregnancy, unspecified trimester: Secondary | ICD-10-CM | POA: Diagnosis not present

## 2022-01-26 LAB — ALPHA FETOPROTEIN, MATERNAL
AFP MoM: 1.12
AFP, Serum: 55.6 ng/mL
Calc'd Gestational Age: 19.6 weeks
Maternal Wt: 196 [lb_av]
Risk for ONTD: <1
Twins-AFP: 1

## 2022-02-20 ENCOUNTER — Ambulatory Visit (INDEPENDENT_AMBULATORY_CARE_PROVIDER_SITE_OTHER): Payer: 59

## 2022-02-20 VITALS — BP 133/87 | HR 108 | Wt 200.0 lb

## 2022-02-20 DIAGNOSIS — Z3402 Encounter for supervision of normal first pregnancy, second trimester: Secondary | ICD-10-CM

## 2022-02-20 DIAGNOSIS — Z3400 Encounter for supervision of normal first pregnancy, unspecified trimester: Secondary | ICD-10-CM

## 2022-02-20 DIAGNOSIS — Z3A23 23 weeks gestation of pregnancy: Secondary | ICD-10-CM

## 2022-02-20 DIAGNOSIS — G35 Multiple sclerosis: Secondary | ICD-10-CM

## 2022-02-20 NOTE — Progress Notes (Signed)
   PRENATAL VISIT NOTE  Subjective:  Teresa Davila is a 31 y.o. G1P0000 at [redacted]w[redacted]d being seen today for ongoing prenatal care.  She is currently monitored for the following issues for this low-risk pregnancy and has Immunosuppression ([place]); Multiple sclerosis (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  Patient reports low back pain that occurs mostly at night. Improves with position changes.  Contractions: Not present. Vag. Bleeding: None.  Movement: Present. Denies leaking of fluid.   The following portions of the patient's history were reviewed and updated as appropriate: allergies, current medications, past family history, past medical history, past social history, past surgical history and problem list.   Objective:   Vitals:   02/20/22 0831  BP: 133/87  Pulse: (!) 108  Weight: 200 lb (90.7 kg)    Fetal Status: Fetal Heart Rate (bpm): 146 Fundal Height: 25 cm Movement: Present     General:  Alert, oriented and cooperative. Patient is in no acute distress.  Skin: Skin is warm and dry. No rash noted.   Cardiovascular: Normal heart rate noted  Respiratory: Normal respiratory effort, no problems with respiration noted  Abdomen: Soft, gravid, appropriate for gestational age.  Pain/Pressure: Absent     Pelvic: Cervical exam deferred        Extremities: Normal range of motion.     Mental Status: Normal mood and affect. Normal behavior. Normal judgment and thought content.   Assessment and Plan:  Pregnancy: G1P0000 at [redacted]w[redacted]d 1. Supervision of normal first pregnancy, antepartum - Routine OB. Doing well - Reassurance provided regarding low back pain. Consider support belt/KT tape during the day. May use Tylenol/heat prn. Consider chiropractor and/or massage therapy - GTT and labs next visit - Anticipatory guidance for upcoming appointments  provided  2. [redacted] weeks gestation of pregnancy - FH appropriate - Endorses active fetal movement  3. Multiple sclerosis ([place]) - Has f/u with neurology in January - Growth ultrasounds per MFM recommendations  Preterm labor symptoms and general obstetric precautions including but not limited to vaginal bleeding, contractions, leaking of fluid and fetal movement were reviewed in detail with the patient. Please refer to After Visit Summary for other counseling recommendations.   Return in about 4 weeks (around 03/20/2022) for ROB, GTT, and labs.  Future Appointments  Date Time Provider [place]  03/23/2022  8:30 AM Micheline Rough [facility]  03/23/2022  3:30 PM [facility] NURSE [facility]  03/23/2022  3:45 PM [facility] US5 [facility]  08/20/2022 10:50 AM Samuel Bouche, NP PCK-PCK None    Renee Harder, CNM

## 2022-03-23 ENCOUNTER — Ambulatory Visit: Payer: No Typology Code available for payment source | Admitting: *Deleted

## 2022-03-23 ENCOUNTER — Ambulatory Visit (INDEPENDENT_AMBULATORY_CARE_PROVIDER_SITE_OTHER): Payer: No Typology Code available for payment source | Admitting: Certified Nurse Midwife

## 2022-03-23 ENCOUNTER — Ambulatory Visit: Payer: No Typology Code available for payment source | Attending: Maternal & Fetal Medicine

## 2022-03-23 ENCOUNTER — Other Ambulatory Visit: Payer: Self-pay | Admitting: *Deleted

## 2022-03-23 VITALS — BP 125/88 | HR 95 | Wt 200.0 lb

## 2022-03-23 VITALS — BP 110/74 | HR 84

## 2022-03-23 DIAGNOSIS — Z3A27 27 weeks gestation of pregnancy: Secondary | ICD-10-CM

## 2022-03-23 DIAGNOSIS — Z6835 Body mass index (BMI) 35.0-35.9, adult: Secondary | ICD-10-CM

## 2022-03-23 DIAGNOSIS — Z362 Encounter for other antenatal screening follow-up: Secondary | ICD-10-CM

## 2022-03-23 DIAGNOSIS — Z3400 Encounter for supervision of normal first pregnancy, unspecified trimester: Secondary | ICD-10-CM

## 2022-03-23 DIAGNOSIS — G35 Multiple sclerosis: Secondary | ICD-10-CM

## 2022-03-23 DIAGNOSIS — O99352 Diseases of the nervous system complicating pregnancy, second trimester: Secondary | ICD-10-CM

## 2022-03-23 DIAGNOSIS — O99212 Obesity complicating pregnancy, second trimester: Secondary | ICD-10-CM

## 2022-03-23 DIAGNOSIS — O99891 Other specified diseases and conditions complicating pregnancy: Secondary | ICD-10-CM

## 2022-03-23 DIAGNOSIS — R102 Pelvic and perineal pain: Secondary | ICD-10-CM

## 2022-03-23 DIAGNOSIS — R030 Elevated blood-pressure reading, without diagnosis of hypertension: Secondary | ICD-10-CM

## 2022-03-23 DIAGNOSIS — Z3402 Encounter for supervision of normal first pregnancy, second trimester: Secondary | ICD-10-CM

## 2022-03-23 DIAGNOSIS — Z148 Genetic carrier of other disease: Secondary | ICD-10-CM | POA: Diagnosis not present

## 2022-03-23 DIAGNOSIS — E669 Obesity, unspecified: Secondary | ICD-10-CM

## 2022-03-23 DIAGNOSIS — Z23 Encounter for immunization: Secondary | ICD-10-CM

## 2022-03-23 LAB — OB RESULTS CONSOLE GBS: GBS: POSITIVE

## 2022-03-23 MED ORDER — ASPIRIN 81 MG PO CHEW: 81.0000 mg | CHEWABLE_TABLET | Freq: Every day | ORAL | 1 refills | Status: DC

## 2022-03-23 NOTE — Progress Notes (Addendum)
Subjective:  Teresa Davila is a 31 y.o. G1P0000 at [redacted]w[redacted]d being seen today for ongoing prenatal care.  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  Patient reports  pressure over bladder area, no dysuria or hematuria, some urgency ; similar sx as with last UTI.  Contractions: Not present. Vag. Bleeding: None.  Movement: Present. Denies leaking of fluid.   The following portions of the patient's history were reviewed and updated as appropriate: allergies, current medications, past family history, past medical history, past social history, past surgical history and problem list. Problem list updated.  Objective:   Vitals:   03/23/22 0815  BP: 125/88  Pulse: 95  Weight: 200 lb (90.7 kg)    Fetal Status: Fetal Heart Rate (bpm): 156 Fundal Height: 29 cm Movement: Present  Presentation: Undeterminable  General:  Alert, oriented and cooperative. Patient is in no acute distress.  Skin: Skin is warm and dry. No rash noted.   Cardiovascular: Normal heart rate noted  Respiratory: Normal respiratory effort, no problems with respiration noted  Abdomen: Soft, gravid, appropriate for gestational age. Pain/Pressure: Present     Pelvic: Vag. Bleeding: None Vag D/C Character: Thin   Cervical exam deferred        Extremities: Normal range of motion.  Edema: None  Mental Status: Normal mood and affect. Normal behavior. Normal judgment and thought content.   Urinalysis:      Assessment and Plan:  Pregnancy: G1P0000 at [redacted]w[redacted]d  1. Supervision of normal first pregnancy, antepartum  - 2Hr GTT w/ 1 Hr Carpenter 75 g - HIV antibody (with reflex) - CBC - RPR - Culture, OB Urine - (refill)aspirin 81 MG chewable tablet; Chew 1 tablet  (81 mg total) by mouth daily. Start after 12 weeks  Dispense: 90 tablet; Refill: 1 - Protein / creatinine ratio, urine - Comprehensive metabolic panel - Tdap vaccine greater than or equal to 7yo IM  2. Multiple sclerosis (HCC) - followed by Neuro - no meds - f/u in January  3. [redacted] weeks gestation of pregnancy  4. Suprapubic pressure - OB urine culture  5. Borderline BPs  - ?CHTN -baseline labs added on  Preterm labor symptoms and general obstetric precautions including but not limited to vaginal bleeding, contractions, leaking of fluid and fetal movement were reviewed in detail with the patient. Please refer to After Visit Summary for other counseling recommendations.  Return in about 6 weeks (around 05/04/2022).   Donette Larry, CNM

## 2022-03-23 NOTE — Patient Instructions (Signed)
Pediatric Providers                                                                                                                                                   Pediatrician List [place]  [facility] @ [facility] [address] [place], [place] [phone]  [facility] @ [facility] [address] [place], [place] [phone]  [facility] @ [place] [address] [place], [place] [phone]  [facility] [address] [place], [place] [phone]  [facility] @ [facility] [address] [place], [place] [phone]  [facility] @ [place] [address] [place], [place] [phone]  [facility] (Pediatrics) [address] [place], [place] [phone]  [facility] @ [address] [phone]  [facility] [address] [place], [place] [phone]  [facility] [address] [place], [place] [phone]  [facility] [address] [phone]  [address] [phone]

## 2022-03-23 NOTE — Progress Notes (Signed)
Pt undecided about Tdap- info given and she will think about it  Pt has noticed increase in pelvic pressure and is requesting urine culture. No other UTI symptoms.

## 2022-03-23 NOTE — Progress Notes (Signed)
Pt here for 28 week labs only

## 2022-03-24 LAB — COMPREHENSIVE METABOLIC PANEL
AG Ratio: 1.3 (calc) (ref 1.0–2.5)
ALT: 28 U/L (ref 6–29)
AST: 19 U/L (ref 10–30)
Albumin: 3.5 g/dL — ABNORMAL LOW (ref 3.6–5.1)
Alkaline phosphatase (APISO): 82 U/L (ref 31–125)
BUN/Creatinine Ratio: 14 (calc) (ref 6–22)
BUN: 6 mg/dL — ABNORMAL LOW (ref 7–25)
CO2: 20 mmol/L (ref 20–32)
Calcium: 8.6 mg/dL (ref 8.6–10.2)
Chloride: 105 mmol/L (ref 98–110)
Creat: 0.44 mg/dL — ABNORMAL LOW (ref 0.50–0.97)
Globulin: 2.6 g/dL (calc) (ref 1.9–3.7)
Glucose, Bld: 77 mg/dL (ref 65–99)
Potassium: 4.1 mmol/L (ref 3.5–5.3)
Sodium: 135 mmol/L (ref 135–146)
Total Bilirubin: 0.3 mg/dL (ref 0.2–1.2)
Total Protein: 6.1 g/dL (ref 6.1–8.1)

## 2022-03-24 LAB — PROTEIN / CREATININE RATIO, URINE
Creatinine, Urine: 122 mg/dL (ref 20–275)
Protein/Creat Ratio: 115 mg/g creat (ref 24–184)
Protein/Creatinine Ratio: 0.115 mg/mg creat (ref 0.024–0.184)
Total Protein, Urine: 14 mg/dL (ref 5–24)

## 2022-03-26 LAB — CBC
HCT: 37.2 % (ref 35.0–45.0)
Hemoglobin: 12.6 g/dL (ref 11.7–15.5)
MCH: 29.9 pg (ref 27.0–33.0)
MCHC: 33.9 g/dL (ref 32.0–36.0)
MCV: 88.2 fL (ref 80.0–100.0)
MPV: 11.7 fL (ref 7.5–12.5)
Platelets: 290 10*3/uL (ref 140–400)
RBC: 4.22 10*6/uL (ref 3.80–5.10)
RDW: 13.1 % (ref 11.0–15.0)
WBC: 11 10*3/uL — ABNORMAL HIGH (ref 3.8–10.8)

## 2022-03-26 LAB — RPR: RPR Ser Ql: NONREACTIVE

## 2022-03-26 LAB — 2HR GTT W 1 HR, CARPENTER, 75 G
Glucose, 1 Hr, Gest: 124 mg/dL (ref 65–179)
Glucose, 2 Hr, Gest: 112 mg/dL (ref 65–152)
Glucose, Fasting, Gest: 72 mg/dL (ref 65–91)

## 2022-03-26 LAB — HIV ANTIBODY (ROUTINE TESTING W REFLEX): HIV 1&2 Ab, 4th Generation: NONREACTIVE

## 2022-03-28 ENCOUNTER — Encounter: Payer: Self-pay | Admitting: Certified Nurse Midwife

## 2022-03-28 ENCOUNTER — Other Ambulatory Visit: Payer: Self-pay | Admitting: Medical

## 2022-03-28 DIAGNOSIS — R8271 Bacteriuria: Secondary | ICD-10-CM

## 2022-03-28 LAB — URINE CULTURE, OB REFLEX

## 2022-03-28 LAB — CULTURE, OB URINE

## 2022-03-28 MED ORDER — CEPHALEXIN 500 MG PO CAPS: 500.0000 mg | ORAL_CAPSULE | Freq: Four times a day (QID) | ORAL | 0 refills | Status: DC

## 2022-03-28 MED ORDER — AMOXICILLIN 500 MG PO CAPS: 500.0000 mg | ORAL_CAPSULE | Freq: Three times a day (TID) | ORAL | 0 refills | Status: DC

## 2022-03-29 ENCOUNTER — Other Ambulatory Visit: Payer: Self-pay | Admitting: Medical

## 2022-03-29 DIAGNOSIS — R8271 Bacteriuria: Secondary | ICD-10-CM

## 2022-03-29 MED ORDER — CEPHALEXIN 250 MG/5ML PO SUSR: 500.0000 mg | Freq: Four times a day (QID) | ORAL | 0 refills | Status: DC

## 2022-04-03 ENCOUNTER — Encounter: Payer: Self-pay | Admitting: Certified Nurse Midwife

## 2022-04-12 ENCOUNTER — Ambulatory Visit (INDEPENDENT_AMBULATORY_CARE_PROVIDER_SITE_OTHER): Payer: No Typology Code available for payment source | Admitting: Obstetrics and Gynecology

## 2022-04-12 VITALS — BP 133/87 | HR 108 | Wt 202.0 lb

## 2022-04-12 DIAGNOSIS — R030 Elevated blood-pressure reading, without diagnosis of hypertension: Secondary | ICD-10-CM

## 2022-04-12 DIAGNOSIS — Z148 Genetic carrier of other disease: Secondary | ICD-10-CM

## 2022-04-12 DIAGNOSIS — O99353 Diseases of the nervous system complicating pregnancy, third trimester: Secondary | ICD-10-CM

## 2022-04-12 DIAGNOSIS — R8271 Bacteriuria: Secondary | ICD-10-CM

## 2022-04-12 DIAGNOSIS — Z3400 Encounter for supervision of normal first pregnancy, unspecified trimester: Secondary | ICD-10-CM

## 2022-04-12 DIAGNOSIS — G35 Multiple sclerosis: Secondary | ICD-10-CM

## 2022-04-12 DIAGNOSIS — Z3A30 30 weeks gestation of pregnancy: Secondary | ICD-10-CM

## 2022-04-12 NOTE — Progress Notes (Signed)
   PRENATAL VISIT NOTE  Subjective:  Teresa Davila is a 31 y.o. G1P0000 at [redacted]w[redacted]d being seen today for ongoing prenatal care.  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  Patient reports she is doing well overall.  Contractions: Not present. Vag. Bleeding: None.  Movement: Present. Denies leaking of fluid.   The following portions of the patient's history were reviewed and updated as appropriate: allergies, current medications, past family history, past medical history, past social history, past surgical history and problem list.   Objective:   Vitals:   04/12/22 1528  BP: 133/87  Pulse: (!) 108  Weight: 202 lb (91.6 kg)    Fetal Status: Fetal Heart Rate (bpm): 153   Movement: Present     General:  Alert, oriented and cooperative. Patient is in no acute distress.  Skin: Skin is warm and dry. No rash noted.   Cardiovascular: Normal heart rate noted  Respiratory: Normal respiratory effort, no problems with respiration noted  Abdomen: Soft, gravid, appropriate for gestational age.  Pain/Pressure: Absent      Assessment and Plan:  Pregnancy: G1P0000 at [redacted]w[redacted]d 1. Supervision of normal first pregnancy, antepartum Normal /3\ labs reviewed  2. Asymptomatic bacteriuria S/p tx w/ keflex - Culture, OB Urine  3. Multiple sclerosis (HCC) No current issues, plans to restart ocrevus after pregnancy Following with neuro - next appt 1/23 Q4wk growth [place] - last 12/8 AGA 30%ile, next 04/25/22 Delivery at 39-40w  4. Elevated BP without diagnosis of hypertension Normotensive today, no medications indicated at this time Normal baseline preE labs, p/c  0.115 Antenatal testing as outlined above. If BP consistently elevated & medication initiated, will need to add weekly BPPs  Return in about 2 weeks (around 04/26/2022) for ROB.  Future Appointments  Date Time Provider Department Center  04/25/2022  8:10 AM Constant, Gigi Gin, MD [facility]  04/25/2022 11:15 AM [facility] NURSE [facility]  04/25/2022 11:30 AM [facility] US2 [facility]  05/09/2022  8:30 AM Lennart Pall, MD [facility]  05/23/2022  1:15 PM [facility] NURSE [facility]  05/23/2022  1:30 PM [facility] US2 [facility]  08/20/2022 10:50 AM Christen Butter, NP PCK-PCK None   Lennart Pall, MD

## 2022-04-14 LAB — CULTURE, OB URINE

## 2022-04-14 LAB — URINE CULTURE, OB REFLEX

## 2022-04-16 NOTE — L&D Delivery Note (Signed)
OB/GYN Faculty Practice Delivery Note  Teresa Davila is a 32 y.o. G1P0000 s/p SVD at [redacted]w[redacted]d She was admitted for IOL for gHTN.   ROM: 4h 084mith clear fluid GBS Status:  Positive/-- (12/08 0000) Maximum Maternal Temperature:  Temp (24hrs), Avg:98 F (36.7 C), Min:97.6 F (36.4 C), Max:98.7 F (37.1 C)    Labor Progress: Patient arrived at 1.5 cm dilation and was induced with cytotec, FB, pitocin.   Delivery Date/Time: 05/27/2022 at 0030 Delivery: Called to room and patient was complete and pushing. Head delivered in ROA position. No nuchal cord present. Shoulder and body delivered in usual fashion. Infant with spontaneous cry, placed on mother's abdomen, dried and stimulated. Cord clamped x 2 after 1-minute delay, and cut by FOB. Cord blood drawn. Placenta delivered spontaneously with gentle cord traction. Fundus firm with massage and Pitocin. Labia, perineum, vagina, and cervix inspected with 1st degree perineal laceration .   Placenta: spontaneous, intact, 3 vessel cord  Complications: None Lacerations: 1st degree perineal repaired  EBL: 209 mL Analgesia: Epidural    Infant: APGAR (1 MIN): 9   APGAR (5 MINS): 9   APGAR (10 MINS):    Weight: pending  ViGifford ShaveMD  OB Fellow  05/27/2022 12:52 AM

## 2022-04-25 ENCOUNTER — Ambulatory Visit: Payer: No Typology Code available for payment source | Attending: Obstetrics

## 2022-04-25 ENCOUNTER — Ambulatory Visit (INDEPENDENT_AMBULATORY_CARE_PROVIDER_SITE_OTHER): Payer: No Typology Code available for payment source | Admitting: Obstetrics and Gynecology

## 2022-04-25 ENCOUNTER — Encounter: Payer: Self-pay | Admitting: Obstetrics and Gynecology

## 2022-04-25 ENCOUNTER — Ambulatory Visit: Payer: No Typology Code available for payment source | Admitting: *Deleted

## 2022-04-25 VITALS — BP 108/74 | HR 88 | Wt 206.0 lb

## 2022-04-25 VITALS — BP 126/74 | HR 70

## 2022-04-25 DIAGNOSIS — O285 Abnormal chromosomal and genetic finding on antenatal screening of mother: Secondary | ICD-10-CM

## 2022-04-25 DIAGNOSIS — O99213 Obesity complicating pregnancy, third trimester: Secondary | ICD-10-CM

## 2022-04-25 DIAGNOSIS — Z3A32 32 weeks gestation of pregnancy: Secondary | ICD-10-CM

## 2022-04-25 DIAGNOSIS — O99353 Diseases of the nervous system complicating pregnancy, third trimester: Secondary | ICD-10-CM | POA: Diagnosis not present

## 2022-04-25 DIAGNOSIS — O99212 Obesity complicating pregnancy, second trimester: Secondary | ICD-10-CM | POA: Diagnosis present

## 2022-04-25 DIAGNOSIS — O99352 Diseases of the nervous system complicating pregnancy, second trimester: Secondary | ICD-10-CM | POA: Diagnosis not present

## 2022-04-25 DIAGNOSIS — G35 Multiple sclerosis: Secondary | ICD-10-CM

## 2022-04-25 DIAGNOSIS — Z148 Genetic carrier of other disease: Secondary | ICD-10-CM

## 2022-04-25 DIAGNOSIS — R030 Elevated blood-pressure reading, without diagnosis of hypertension: Secondary | ICD-10-CM

## 2022-04-25 DIAGNOSIS — E669 Obesity, unspecified: Secondary | ICD-10-CM

## 2022-04-25 DIAGNOSIS — Z6835 Body mass index (BMI) 35.0-35.9, adult: Secondary | ICD-10-CM

## 2022-04-25 DIAGNOSIS — Z3403 Encounter for supervision of normal first pregnancy, third trimester: Secondary | ICD-10-CM

## 2022-04-25 DIAGNOSIS — R8271 Bacteriuria: Secondary | ICD-10-CM

## 2022-04-25 NOTE — Progress Notes (Signed)
   PRENATAL VISIT NOTE  Subjective:  Teresa Davila is a 32 y.o. G1P0000 at [redacted]w[redacted]d being seen today for ongoing prenatal care.  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  Patient reports no complaints.  Contractions: Not present. Vag. Bleeding: None.  Movement: Present. Denies leaking of fluid.   The following portions of the patient's history were reviewed and updated as appropriate: allergies, current medications, past family history, past medical history, past social history, past surgical history and problem list.   Objective:   Vitals:   04/25/22 0803  BP: 108/74  Pulse: 88  Weight: 206 lb (93.4 kg)    Fetal Status: Fetal Heart Rate (bpm): 145 Fundal Height: 32 cm Movement: Present     General:  Alert, oriented and cooperative. Patient is in no acute distress.  Skin: Skin is warm and dry. No rash noted.   Cardiovascular: Normal heart rate noted  Respiratory: Normal respiratory effort, no problems with respiration noted  Abdomen: Soft, gravid, appropriate for gestational age.  Pain/Pressure: Absent     Pelvic: Cervical exam deferred        Extremities: Normal range of motion.  Edema: Trace  Mental Status: Normal mood and affect. Normal behavior. Normal judgment and thought content.   Assessment and Plan:  Pregnancy: G1P0000 at [redacted]w[redacted]d 1. Encounter for supervision of normal first pregnancy in third trimester Patient is doing well without complaints Patient is still researching pediatrician Plans pills or depo for contraception- Plans to discuss with neurologist to ensure no contraindications  2. Multiple sclerosis ([place]) Stable Scheduled with  neurologist on 1/23 Follow-up growth ultrasound today  3. Asymptomatic bacteriuria Negative culture last visit  4. Elevated BP without diagnosis of hypertension Normotensive and asymptomatic today  Preterm labor symptoms and general obstetric precautions including but not limited to vaginal bleeding, contractions, leaking of fluid and fetal movement were reviewed in detail with the patient. Please refer to After Visit Summary for other counseling recommendations.   Return in about 2 weeks (around 05/09/2022) for in person, ROB, High risk.  Future Appointments  Date Time Provider [place]  04/25/2022 11:15 AM [facility] NURSE [facility]  04/25/2022 11:30 AM [facility] US2 [facility]  05/09/2022  8:30 AM Inez Catalina, MD [facility]  05/23/2022  1:15 PM [facility] NURSE [facility]  05/23/2022  1:30 PM [facility] US2 [facility]  08/20/2022 10:50 AM Samuel Bouche, NP PCK-PCK None    Mora Bellman, MD

## 2022-05-01 ENCOUNTER — Encounter: Payer: Self-pay | Admitting: Obstetrics and Gynecology

## 2022-05-09 ENCOUNTER — Encounter: Payer: Self-pay | Admitting: Obstetrics and Gynecology

## 2022-05-09 ENCOUNTER — Ambulatory Visit: Payer: No Typology Code available for payment source | Admitting: Obstetrics and Gynecology

## 2022-05-09 VITALS — BP 123/86 | HR 85 | Wt 208.0 lb

## 2022-05-09 DIAGNOSIS — Z148 Genetic carrier of other disease: Secondary | ICD-10-CM

## 2022-05-09 DIAGNOSIS — R030 Elevated blood-pressure reading, without diagnosis of hypertension: Secondary | ICD-10-CM

## 2022-05-09 DIAGNOSIS — G35 Multiple sclerosis: Secondary | ICD-10-CM

## 2022-05-09 DIAGNOSIS — R8271 Bacteriuria: Secondary | ICD-10-CM

## 2022-05-09 DIAGNOSIS — Q2112 Patent foramen ovale: Secondary | ICD-10-CM

## 2022-05-09 DIAGNOSIS — Z3A34 34 weeks gestation of pregnancy: Secondary | ICD-10-CM

## 2022-05-09 DIAGNOSIS — Z3403 Encounter for supervision of normal first pregnancy, third trimester: Secondary | ICD-10-CM

## 2022-05-09 NOTE — Progress Notes (Signed)
   PRENATAL VISIT NOTE  Subjective:  Teresa Davila is a 32 y.o. G1P0000 at [redacted]w[redacted]d being seen today for ongoing prenatal care.  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  Patient reports  she is doing well. No MS flare .  Contractions: Not present. Vag. Bleeding: None.  Movement: Present. Denies leaking of fluid.   The following portions of the patient's history were reviewed and updated as appropriate: allergies, current medications, past family history, past medical history, past social history, past surgical history and problem list.   Objective:   Vitals:   05/09/22 0833  BP: 123/86  Pulse: 85  Weight: 208 lb (94.3 kg)   Fetal Status: Fetal Heart Rate (bpm): 145   Movement: Present     General:  Alert, oriented and cooperative. Patient is in no acute distress.  Skin: Skin is warm and dry. No rash noted.   Cardiovascular: Normal heart rate noted  Respiratory: Normal respiratory effort, no problems with respiration noted  Abdomen: Soft, gravid, appropriate for gestational age.  Pain/Pressure: Absent      Assessment and Plan:  Pregnancy: G1P0000 at [redacted]w[redacted]d 1. Encounter for supervision of normal first pregnancy in third trimester 2. [redacted] weeks gestation of pregnancy @32 /4: 1957g (33%), ceph, post, AFI 13.17 --  next 2/7 Discussed GBS next appt  3. Multiple sclerosis ([place]) Saw neuro yesterday - leaning towards breastfeeding until her appt/MRI in march and then deciding if she will stop BF to start ocrevus or hold on ocrevus until she is done breastfeeding Growth q4w - next 2/7 For delivery at 39-40 weeks  4. Elevated BP without diagnosis of  hypertension BP 140/90 at neuro appointment yesterday, normotensive here and typically normotensive w/ baby scripts - c/w baseline this pregnancy Discussed warning si/sx for preE and to notify us if BP persistently elevated. Reviewed reasons to present to MAU  5. Carrier of spinal muscular atrophy Reviewed diagnosis with patient - she will discuss with parter re: partner testing  6. Asymptomatic bacteriuria Negative TOC  7. Patent foramen ovale No active issues  Return in about 2 weeks (around 05/23/2022) for return OB at 36 weeks with GBS.  Future Appointments  Date Time Provider [place]  05/23/2022 10:50 AM Radene Gunning, MD [facility]  05/23/2022  1:15 PM [facility] NURSE [facility]  05/23/2022  1:30 PM [facility] US2 [facility]  05/30/2022 11:10 AM Radene Gunning, MD [facility]  06/06/2022 11:10 AM Inez Catalina, MD [facility]  06/13/2022 11:10 AM Inez Catalina, MD [facility]  08/20/2022 10:50 AM Samuel Bouche, NP PCK-PCK None   Inez Catalina, MD

## 2022-05-14 ENCOUNTER — Encounter: Payer: Self-pay | Admitting: Obstetrics & Gynecology

## 2022-05-17 ENCOUNTER — Encounter: Payer: Self-pay | Admitting: Obstetrics and Gynecology

## 2022-05-17 DIAGNOSIS — Z419 Encounter for procedure for purposes other than remedying health state, unspecified: Secondary | ICD-10-CM | POA: Diagnosis not present

## 2022-05-21 ENCOUNTER — Other Ambulatory Visit (HOSPITAL_COMMUNITY): Payer: Self-pay | Admitting: Advanced Practice Midwife

## 2022-05-21 ENCOUNTER — Ambulatory Visit (INDEPENDENT_AMBULATORY_CARE_PROVIDER_SITE_OTHER): Payer: No Typology Code available for payment source | Admitting: Obstetrics & Gynecology

## 2022-05-21 ENCOUNTER — Other Ambulatory Visit (HOSPITAL_COMMUNITY)
Admission: RE | Admit: 2022-05-21 | Discharge: 2022-05-21 | Disposition: A | Discharge status: Home / Self Care | Payer: No Typology Code available for payment source | Source: Ambulatory Visit | Attending: Obstetrics & Gynecology | Admitting: Obstetrics & Gynecology

## 2022-05-21 VITALS — BP 138/92 | HR 90 | Wt 218.0 lb

## 2022-05-21 DIAGNOSIS — O163 Unspecified maternal hypertension, third trimester: Secondary | ICD-10-CM

## 2022-05-21 DIAGNOSIS — Z3A36 36 weeks gestation of pregnancy: Secondary | ICD-10-CM

## 2022-05-21 DIAGNOSIS — O133 Gestational [pregnancy-induced] hypertension without significant proteinuria, third trimester: Secondary | ICD-10-CM

## 2022-05-21 DIAGNOSIS — O139 Gestational [pregnancy-induced] hypertension without significant proteinuria, unspecified trimester: Secondary | ICD-10-CM

## 2022-05-21 DIAGNOSIS — Z3403 Encounter for supervision of normal first pregnancy, third trimester: Secondary | ICD-10-CM | POA: Insufficient documentation

## 2022-05-21 NOTE — Progress Notes (Signed)
   PRENATAL VISIT NOTE  Subjective:  Teresa Davila is a 32 y.o. G1P0000 at [redacted]w[redacted]d being seen today for ongoing prenatal care.  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  Patient reports  leg swelling, eleated BPs taken at home and weight gain.   .  Contractions: Not present. Vag. Bleeding: None.  Movement: Present. Denies leaking of fluid. No visual changes, RUQ pain, or headache.  The following portions of the patient's history were reviewed and updated as appropriate: allergies, current medications, past family history, past medical history, past social history, past surgical history and problem list.   Objective:   Vitals:   05/21/22 1052 05/21/22 1056  BP: (!) 140/97 (!) 138/92  Pulse: 90   Weight: 218 lb (98.9 kg)     Fetal Status: Fetal Heart Rate (bpm): 138   Movement: Present     General:  Alert, oriented and cooperative. Patient is in no acute distress.  Skin: Skin is warm and dry. No rash noted.   Cardiovascular: Normal heart rate noted  Respiratory: Normal respiratory effort, no problems with respiration noted  Abdomen: Soft, gravid, appropriate for gestational age.  Pain/Pressure: Absent     Pelvic: Cervical exam performed in the presence of a chaperone        Extremities: Normal range of motion.  Edema: Mild pitting, slight indentation  Mental Status: Normal mood and affect. Normal behavior. Normal judgment and thought content.   Assessment and Plan:  Pregnancy: G1P0000 at [redacted]w[redacted]d 1. Encounter for supervision of normal first pregnancy in third trimester - Cervicovaginal ancillary only( [place]) - Protein / creatinine ratio, urine -  Comprehensive metabolic panel - CBC  2. Gestational HTN Reviewed several years of BPs.  Teresa Davila has never had an elevated BP prior to pregnancy.  They are crescendoing, persisitently staying elevated on home readings and she now has marked weight gain.   - Protein / creatinine ratio, urine - Comprehensive metabolic panel - CBC NST today Baseline:  135 Accelerations: presetn Decelerations:absent Variability: moderate Interpretation:  Reactive NST Set induction for 37 weeks   Term labor symptoms and general obstetric precautions including but not limited to vaginal bleeding, contractions, leaking of fluid and fetal movement were reviewed in detail with the patient. Please refer to After Visit Summary for other counseling recommendations.   No follow-ups on file.  Future Appointments  Date Time Provider [place]  05/23/2022  1:15 PM [facility] NURSE [facility]  05/23/2022  1:30 PM [facility] US2 [facility]  05/30/2022 11:10 AM Radene Gunning, MD [facility]  06/06/2022 11:10 AM Inez Catalina, MD [facility]  06/13/2022 11:10 AM Inez Catalina, MD [facility]  08/20/2022 10:50 AM Samuel Bouche, NP PCK-PCK None    Silas [place], MD

## 2022-05-22 ENCOUNTER — Telehealth (HOSPITAL_COMMUNITY): Payer: Self-pay | Admitting: *Deleted

## 2022-05-22 ENCOUNTER — Encounter: Payer: Self-pay | Admitting: *Deleted

## 2022-05-22 ENCOUNTER — Encounter (HOSPITAL_COMMUNITY): Payer: Self-pay

## 2022-05-22 ENCOUNTER — Encounter (HOSPITAL_COMMUNITY): Payer: Self-pay | Admitting: *Deleted

## 2022-05-22 LAB — CERVICOVAGINAL ANCILLARY ONLY
Bacterial Vaginitis (gardnerella): NEGATIVE
Candida Glabrata: NEGATIVE
Candida Vaginitis: POSITIVE — AB
Chlamydia: NEGATIVE
Comment: NEGATIVE
Comment: NEGATIVE
Comment: NEGATIVE
Comment: NEGATIVE
Comment: NEGATIVE
Comment: NORMAL
Neisseria Gonorrhea: NEGATIVE
Trichomonas: NEGATIVE

## 2022-05-22 NOTE — Telephone Encounter (Signed)
Preadmission screen  

## 2022-05-23 ENCOUNTER — Ambulatory Visit (HOSPITAL_BASED_OUTPATIENT_CLINIC_OR_DEPARTMENT_OTHER): Payer: No Typology Code available for payment source

## 2022-05-23 ENCOUNTER — Encounter: Payer: No Typology Code available for payment source | Admitting: Obstetrics and Gynecology

## 2022-05-23 ENCOUNTER — Other Ambulatory Visit: Payer: Self-pay | Admitting: Advanced Practice Midwife

## 2022-05-23 ENCOUNTER — Ambulatory Visit: Payer: No Typology Code available for payment source

## 2022-05-23 VITALS — BP 133/93 | HR 102

## 2022-05-23 DIAGNOSIS — R030 Elevated blood-pressure reading, without diagnosis of hypertension: Secondary | ICD-10-CM | POA: Insufficient documentation

## 2022-05-23 DIAGNOSIS — O99353 Diseases of the nervous system complicating pregnancy, third trimester: Secondary | ICD-10-CM | POA: Insufficient documentation

## 2022-05-23 DIAGNOSIS — Z3A36 36 weeks gestation of pregnancy: Secondary | ICD-10-CM | POA: Insufficient documentation

## 2022-05-23 DIAGNOSIS — O99213 Obesity complicating pregnancy, third trimester: Secondary | ICD-10-CM | POA: Diagnosis not present

## 2022-05-23 DIAGNOSIS — O285 Abnormal chromosomal and genetic finding on antenatal screening of mother: Secondary | ICD-10-CM

## 2022-05-23 DIAGNOSIS — O1213 Gestational proteinuria, third trimester: Secondary | ICD-10-CM | POA: Insufficient documentation

## 2022-05-23 DIAGNOSIS — E669 Obesity, unspecified: Secondary | ICD-10-CM

## 2022-05-23 DIAGNOSIS — O139 Gestational [pregnancy-induced] hypertension without significant proteinuria, unspecified trimester: Secondary | ICD-10-CM

## 2022-05-23 DIAGNOSIS — O163 Unspecified maternal hypertension, third trimester: Secondary | ICD-10-CM | POA: Insufficient documentation

## 2022-05-23 DIAGNOSIS — G35 Multiple sclerosis: Secondary | ICD-10-CM | POA: Insufficient documentation

## 2022-05-23 DIAGNOSIS — Z6835 Body mass index (BMI) 35.0-35.9, adult: Secondary | ICD-10-CM | POA: Insufficient documentation

## 2022-05-23 DIAGNOSIS — Z3403 Encounter for supervision of normal first pregnancy, third trimester: Secondary | ICD-10-CM | POA: Insufficient documentation

## 2022-05-23 DIAGNOSIS — Z148 Genetic carrier of other disease: Secondary | ICD-10-CM

## 2022-05-23 DIAGNOSIS — O133 Gestational [pregnancy-induced] hypertension without significant proteinuria, third trimester: Secondary | ICD-10-CM

## 2022-05-23 DIAGNOSIS — O99352 Diseases of the nervous system complicating pregnancy, second trimester: Secondary | ICD-10-CM | POA: Insufficient documentation

## 2022-05-23 DIAGNOSIS — O99212 Obesity complicating pregnancy, second trimester: Secondary | ICD-10-CM | POA: Insufficient documentation

## 2022-05-24 ENCOUNTER — Other Ambulatory Visit: Payer: Self-pay | Admitting: Obstetrics & Gynecology

## 2022-05-24 DIAGNOSIS — O139 Gestational [pregnancy-induced] hypertension without significant proteinuria, unspecified trimester: Secondary | ICD-10-CM

## 2022-05-24 DIAGNOSIS — O163 Unspecified maternal hypertension, third trimester: Secondary | ICD-10-CM

## 2022-05-24 DIAGNOSIS — Z3403 Encounter for supervision of normal first pregnancy, third trimester: Secondary | ICD-10-CM

## 2022-05-24 LAB — COMPREHENSIVE METABOLIC PANEL
ALT: 23 IU/L (ref 0–32)
AST: 24 IU/L (ref 0–40)
Albumin/Globulin Ratio: 1.4 (ref 1.2–2.2)
Albumin: 3.4 g/dL — ABNORMAL LOW (ref 4.0–5.0)
Alkaline Phosphatase: 130 IU/L — ABNORMAL HIGH (ref 44–121)
BUN/Creatinine Ratio: 11 (ref 9–23)
BUN: 6 mg/dL (ref 6–20)
Bilirubin Total: 0.2 mg/dL (ref 0.0–1.2)
CO2: 19 mmol/L — ABNORMAL LOW (ref 20–29)
Calcium: 9 mg/dL (ref 8.7–10.2)
Chloride: 101 mmol/L (ref 96–106)
Creatinine, Ser: 0.55 mg/dL — ABNORMAL LOW (ref 0.57–1.00)
Globulin, Total: 2.5 g/dL (ref 1.5–4.5)
Glucose: 94 mg/dL (ref 70–99)
Potassium: 4 mmol/L (ref 3.5–5.2)
Sodium: 136 mmol/L (ref 134–144)
Total Protein: 5.9 g/dL — ABNORMAL LOW (ref 6.0–8.5)
eGFR: 126 mL/min/{1.73_m2} (ref >59)

## 2022-05-24 LAB — CBC
Hematocrit: 35.1 % (ref 34.0–46.6)
Hemoglobin: 12 g/dL (ref 11.1–15.9)
MCH: 29.4 pg (ref 26.6–33.0)
MCHC: 34.2 g/dL (ref 31.5–35.7)
MCV: 86 fL (ref 79–97)
Platelets: 294 10*3/uL (ref 150–450)
RBC: 4.08 x10E6/uL (ref 3.77–5.28)
RDW: 13.1 % (ref 11.7–15.4)
WBC: 10.7 10*3/uL (ref 3.4–10.8)

## 2022-05-24 LAB — PROTEIN / CREATININE RATIO, URINE
Creatinine, Urine: 55.3 mg/dL
Protein, Ur: 13.2 mg/dL
Protein/Creat Ratio: 239 mg/g creat — ABNORMAL HIGH (ref 0–200)

## 2022-05-25 ENCOUNTER — Other Ambulatory Visit: Payer: Self-pay | Admitting: Advanced Practice Midwife

## 2022-05-25 DIAGNOSIS — O133 Gestational [pregnancy-induced] hypertension without significant proteinuria, third trimester: Secondary | ICD-10-CM

## 2022-05-26 ENCOUNTER — Inpatient Hospital Stay (HOSPITAL_COMMUNITY): Payer: No Typology Code available for payment source | Admitting: Anesthesiology

## 2022-05-26 ENCOUNTER — Inpatient Hospital Stay (HOSPITAL_COMMUNITY): Payer: No Typology Code available for payment source

## 2022-05-26 ENCOUNTER — Encounter (HOSPITAL_COMMUNITY): Payer: Self-pay | Admitting: Obstetrics & Gynecology

## 2022-05-26 ENCOUNTER — Other Ambulatory Visit: Payer: Self-pay | Admitting: Obstetrics & Gynecology

## 2022-05-26 ENCOUNTER — Other Ambulatory Visit: Payer: Self-pay

## 2022-05-26 ENCOUNTER — Inpatient Hospital Stay (HOSPITAL_COMMUNITY)
Admission: RE | Admit: 2022-05-26 | Discharge: 2022-05-29 | DRG: 806 | Disposition: A | Discharge status: Home / Self Care | Payer: No Typology Code available for payment source | Attending: Obstetrics & Gynecology | Admitting: Obstetrics & Gynecology

## 2022-05-26 DIAGNOSIS — O99354 Diseases of the nervous system complicating childbirth: Secondary | ICD-10-CM | POA: Diagnosis present

## 2022-05-26 DIAGNOSIS — O99214 Obesity complicating childbirth: Secondary | ICD-10-CM | POA: Diagnosis present

## 2022-05-26 DIAGNOSIS — O99824 Streptococcus B carrier state complicating childbirth: Secondary | ICD-10-CM | POA: Diagnosis present

## 2022-05-26 DIAGNOSIS — O134 Gestational [pregnancy-induced] hypertension without significant proteinuria, complicating childbirth: Secondary | ICD-10-CM | POA: Diagnosis present

## 2022-05-26 DIAGNOSIS — Z148 Genetic carrier of other disease: Secondary | ICD-10-CM | POA: Diagnosis not present

## 2022-05-26 DIAGNOSIS — O139 Gestational [pregnancy-induced] hypertension without significant proteinuria, unspecified trimester: Secondary | ICD-10-CM | POA: Diagnosis present

## 2022-05-26 DIAGNOSIS — O99820 Streptococcus B carrier state complicating pregnancy: Secondary | ICD-10-CM | POA: Diagnosis not present

## 2022-05-26 DIAGNOSIS — G35 Multiple sclerosis: Secondary | ICD-10-CM | POA: Diagnosis present

## 2022-05-26 DIAGNOSIS — R8271 Bacteriuria: Secondary | ICD-10-CM | POA: Diagnosis present

## 2022-05-26 DIAGNOSIS — Z7982 Long term (current) use of aspirin: Secondary | ICD-10-CM | POA: Diagnosis not present

## 2022-05-26 DIAGNOSIS — Z3A37 37 weeks gestation of pregnancy: Secondary | ICD-10-CM | POA: Diagnosis not present

## 2022-05-26 DIAGNOSIS — O133 Gestational [pregnancy-induced] hypertension without significant proteinuria, third trimester: Principal | ICD-10-CM

## 2022-05-26 DIAGNOSIS — R030 Elevated blood-pressure reading, without diagnosis of hypertension: Secondary | ICD-10-CM | POA: Diagnosis present

## 2022-05-26 DIAGNOSIS — Z3400 Encounter for supervision of normal first pregnancy, unspecified trimester: Secondary | ICD-10-CM

## 2022-05-26 LAB — COMPREHENSIVE METABOLIC PANEL
ALT: 26 U/L (ref 0–44)
AST: 28 U/L (ref 15–41)
Albumin: 2.5 g/dL — ABNORMAL LOW (ref 3.5–5.0)
Alkaline Phosphatase: 102 U/L (ref 38–126)
Anion gap: 11 (ref 5–15)
BUN: 5 mg/dL — ABNORMAL LOW (ref 6–20)
CO2: 21 mmol/L — ABNORMAL LOW (ref 22–32)
Calcium: 8.4 mg/dL — ABNORMAL LOW (ref 8.9–10.3)
Chloride: 103 mmol/L (ref 98–111)
Creatinine, Ser: 0.61 mg/dL (ref 0.44–1.00)
GFR, Estimated: >60 mL/min (ref >60)
Glucose, Bld: 88 mg/dL (ref 70–99)
Potassium: 3.5 mmol/L (ref 3.5–5.1)
Sodium: 135 mmol/L (ref 135–145)
Total Bilirubin: 0.2 mg/dL — ABNORMAL LOW (ref 0.3–1.2)
Total Protein: 5.8 g/dL — ABNORMAL LOW (ref 6.5–8.1)

## 2022-05-26 LAB — CBC
HCT: 32.7 % — ABNORMAL LOW (ref 36.0–46.0)
HCT: 35.3 % — ABNORMAL LOW (ref 36.0–46.0)
Hemoglobin: 11.3 g/dL — ABNORMAL LOW (ref 12.0–15.0)
Hemoglobin: 12.1 g/dL (ref 12.0–15.0)
MCH: 29.6 pg (ref 26.0–34.0)
MCH: 29.5 pg (ref 26.0–34.0)
MCHC: 34.6 g/dL (ref 30.0–36.0)
MCHC: 34.3 g/dL (ref 30.0–36.0)
MCV: 85.6 fL (ref 80.0–100.0)
MCV: 86.1 fL (ref 80.0–100.0)
Platelets: 256 10*3/uL (ref 150–400)
Platelets: 259 10*3/uL (ref 150–400)
RBC: 3.82 MIL/uL — ABNORMAL LOW (ref 3.87–5.11)
RBC: 4.1 MIL/uL (ref 3.87–5.11)
RDW: 13.4 % (ref 11.5–15.5)
RDW: 13.5 % (ref 11.5–15.5)
WBC: 10.5 10*3/uL (ref 4.0–10.5)
WBC: 15.8 10*3/uL — ABNORMAL HIGH (ref 4.0–10.5)
nRBC: 0 % (ref 0.0–0.2)
nRBC: 0 % (ref 0.0–0.2)

## 2022-05-26 LAB — PROTEIN / CREATININE RATIO, URINE
Creatinine, Urine: 93 mg/dL
Protein Creatinine Ratio: 0.13 mg/mg{Cre} (ref 0.00–0.15)
Total Protein, Urine: 12 mg/dL

## 2022-05-26 LAB — TYPE AND SCREEN
ABO/RH(D): AB POS
Antibody Screen: NEGATIVE

## 2022-05-26 LAB — RPR: RPR Ser Ql: NONREACTIVE

## 2022-05-26 MED ORDER — TERBUTALINE SULFATE 1 MG/ML IJ SOLN: 0.2500 mg | Freq: Once | INTRAMUSCULAR | Status: DC | PRN

## 2022-05-26 MED ORDER — ACETAMINOPHEN 325 MG PO TABS: 650.0000 mg | ORAL_TABLET | ORAL | Status: DC | PRN

## 2022-05-26 MED ORDER — PHENYLEPHRINE 80 MCG/ML (10ML) SYRINGE FOR IV PUSH (FOR BLOOD PRESSURE SUPPORT): 80.0000 ug | PREFILLED_SYRINGE | INTRAVENOUS | Status: DC | PRN

## 2022-05-26 MED ORDER — LACTATED RINGERS IV SOLN: INTRAVENOUS | Status: DC

## 2022-05-26 MED ORDER — OXYCODONE-ACETAMINOPHEN 5-325 MG PO TABS: 2.0000 | ORAL_TABLET | ORAL | Status: DC | PRN

## 2022-05-26 MED ORDER — ONDANSETRON HCL 4 MG/2ML IJ SOLN: 4.0000 mg | Freq: Four times a day (QID) | INTRAMUSCULAR | Status: DC | PRN

## 2022-05-26 MED ORDER — DIPHENHYDRAMINE HCL 50 MG/ML IJ SOLN
12.5000 mg | INTRAMUSCULAR | Status: DC | PRN
  Administered 2022-05-26: 12.5 mg via INTRAVENOUS
  Filled 2022-05-26: qty 1

## 2022-05-26 MED ORDER — OXYTOCIN-SODIUM CHLORIDE 30-0.9 UT/500ML-% IV SOLN
1.0000 m[IU]/min | INTRAVENOUS | Status: DC
  Administered 2022-05-26: 2 m[IU]/min via INTRAVENOUS
  Filled 2022-05-26: qty 500

## 2022-05-26 MED ORDER — EPHEDRINE 5 MG/ML INJ: 10.0000 mg | INTRAVENOUS | Status: DC | PRN

## 2022-05-26 MED ORDER — LACTATED RINGERS IV SOLN: 500.0000 mL | INTRAVENOUS | Status: DC | PRN

## 2022-05-26 MED ORDER — LIDOCAINE HCL (PF) 1 % IJ SOLN: 30.0000 mL | INTRAMUSCULAR | Status: DC | PRN

## 2022-05-26 MED ORDER — FENTANYL CITRATE (PF) 100 MCG/2ML IJ SOLN: 100.0000 ug | INTRAMUSCULAR | Status: DC | PRN

## 2022-05-26 MED ORDER — MISOPROSTOL 25 MCG QUARTER TABLET
25.0000 ug | ORAL_TABLET | Freq: Once | ORAL | Status: AC
  Administered 2022-05-26: 25 ug via VAGINAL
  Filled 2022-05-26: qty 1

## 2022-05-26 MED ORDER — MISOPROSTOL 50MCG HALF TABLET
50.0000 ug | ORAL_TABLET | Freq: Once | ORAL | Status: AC
  Administered 2022-05-26: 50 ug via ORAL
  Filled 2022-05-26: qty 1

## 2022-05-26 MED ORDER — OXYTOCIN BOLUS FROM INFUSION
333.0000 mL | Freq: Once | INTRAVENOUS | Status: AC
  Administered 2022-05-27: 333 mL via INTRAVENOUS

## 2022-05-26 MED ORDER — OXYTOCIN-SODIUM CHLORIDE 30-0.9 UT/500ML-% IV SOLN: 2.5000 [IU]/h | INTRAVENOUS | Status: DC

## 2022-05-26 MED ORDER — OXYCODONE-ACETAMINOPHEN 5-325 MG PO TABS: 1.0000 | ORAL_TABLET | ORAL | Status: DC | PRN

## 2022-05-26 MED ORDER — LIDOCAINE HCL (PF) 1 % IJ SOLN
INTRAMUSCULAR | Status: DC | PRN
  Administered 2022-05-26: 3 mL via EPIDURAL
  Administered 2022-05-26: 5 mL via EPIDURAL

## 2022-05-26 MED ORDER — SODIUM CHLORIDE 0.9 % IV SOLN
5.0000 10*6.[IU] | Freq: Once | INTRAVENOUS | Status: AC
  Administered 2022-05-26: 5 10*6.[IU] via INTRAVENOUS
  Filled 2022-05-26: qty 5

## 2022-05-26 MED ORDER — LACTATED RINGERS IV SOLN
500.0000 mL | Freq: Once | INTRAVENOUS | Status: AC
  Administered 2022-05-26: 500 mL via INTRAVENOUS

## 2022-05-26 MED ORDER — FENTANYL-BUPIVACAINE-NACL 0.5-0.125-0.9 MG/250ML-% EP SOLN
12.0000 mL/h | EPIDURAL | Status: DC | PRN
  Administered 2022-05-26: 12 mL/h via EPIDURAL
  Filled 2022-05-26: qty 250

## 2022-05-26 MED ORDER — PENICILLIN G POT IN DEXTROSE 60000 UNIT/ML IV SOLN
3.0000 10*6.[IU] | INTRAVENOUS | Status: DC
  Administered 2022-05-26 (×3): 3 10*6.[IU] via INTRAVENOUS
  Filled 2022-05-26 (×3): qty 50

## 2022-05-26 MED ORDER — SOD CITRATE-CITRIC ACID 500-334 MG/5ML PO SOLN: 30.0000 mL | ORAL | Status: DC | PRN

## 2022-05-26 NOTE — Progress Notes (Signed)
Labor Progress Note Ingri Kata is a 32 y.o. G1P0000 at 71w0dpresented for IOL for gHTN  S:  Starting to feel mild to moderate ctx. Declines need for pain meds.   O:  BP 125/72 (BP Location: Left Arm)   Pulse 79   Temp 98.1 F (36.7 C) (Oral)   Resp 18   Ht 5' 2"$  (1.575 m)   Wt 98 kg   LMP 09/09/2021   BMI 39.51 kg/m  EFM: baseline 140 bpm/ mod variability/ + accels/ no decels  Toco/IUPC: irregular SVE: Dilation: 3 Effacement (%): 50 Cervical Position: Posterior Exam by:: AWallie RenshawRN Pitocin: 2 mu/min  A/P: 32y.o. G1P0000 363w0d1. Labor: latent 2. FWB: Cat I 3. Pain: analgesia/anesthesia/NO prn 4. gHTN: stable, no sx, normal labs  Start Pitocin. Anticipate labor progress and SVD.  MeJulianne HandlerCNM 2:40 PM

## 2022-05-26 NOTE — Anesthesia Procedure Notes (Signed)
Epidural Patient location during procedure: OB Start time: 05/26/2022 9:23 PM End time: 05/26/2022 9:31 PM  Staffing Anesthesiologist: Josephine Igo, MD Performed: anesthesiologist   Preanesthetic Checklist Completed: patient identified, IV checked, site marked, risks and benefits discussed, surgical consent, monitors and equipment checked, pre-op evaluation and timeout performed  Epidural Patient position: sitting Prep: DuraPrep and site prepped and draped Patient monitoring: continuous pulse ox and blood pressure Approach: midline Location: L3-L4 Injection technique: LOR air  Needle:  Needle type: Tuohy  Needle gauge: 17 G Needle length: 9 cm and 9 Needle insertion depth: 6 cm Catheter type: closed end flexible Catheter size: 19 Gauge Catheter at skin depth: 11 cm Test dose: negative and Other  Assessment Events: blood not aspirated, no cerebrospinal fluid, injection not painful, no injection resistance, no paresthesia and negative IV test  Additional Notes Patient identified. Risks and benefits discussed including failed block, incomplete  Pain control, post dural puncture headache, nerve damage, paralysis, blood pressure Changes, nausea, vomiting, reactions to medications-both toxic and allergic and post Partum back pain. All questions were answered. Patient expressed understanding and wished to proceed. Sterile technique was used throughout procedure. Epidural site was Dressed with sterile barrier dressing. No paresthesias, signs of intravascular injection Or signs of intrathecal spread were encountered.  Patient was more comfortable after the epidural was dosed. Please see RN's note for documentation of vital signs and FHR which are stable. Reason for block:procedure for pain

## 2022-05-26 NOTE — Progress Notes (Signed)
Labor Progress Note Teresa Davila is a 32 y.o. G1P0000 at 32w0dpresented for IOL for gHTN. S/p Cytotec x1 and FB.  S:  Feeling ctx but manageable, currently in right lateral with peanut ball. Denies need for pain meds.  O:  BP 100/64   Pulse 90   Temp 97.6 F (36.4 C) (Oral)   Resp 17   Ht 5' 2"$  (1.575 m)   Wt 98 kg   LMP 09/09/2021   SpO2 99%   BMI 39.51 kg/m  EFM: baseline 140 bpm/ mod variability/ + accels/ occ  early decels  Toco/IUPC: 2-3 SVE: Dilation: 8.5 Effacement (%): 90 Cervical Position: Middle Station: Plus 1 Presentation: Vertex Exam by:: Lindsay Straka MD   A/P: 32y.o. G1P0000 31w0d1. Labor: Active 2. FWB: Cat II 3. Pain: epidural in place  4. gHTN: stable  Continue Pitocin titration. SROM prior to my cervical exam. . Anticipate SVD.  JoConcepcion LivingMD 10:32 PM

## 2022-05-26 NOTE — H&P (Signed)
OBSTETRIC ADMISSION HISTORY AND PHYSICAL  Teresa Davila is a 32 y.o. female G1P0000 with IUP at 50w0dpresenting for IOL for gHTN. She reports +FMs. No LOF, VB, blurry vision, headaches, peripheral edema, or RUQ pain. She plans on breastfeeding. She requests POP or Depo for birth control.  Dating: By LMP --->  Estimated Date of Delivery: 06/16/22  Sono:    @[redacted]w[redacted]d$ , normal anatomy, cephalic presentation, [id number] 60%ile, EFW 6'11  Prenatal History/Complications: -gHTN -SMA carrier -MS -patent FA -GBS bacteriuria -obesity  Past Medical History: Past Medical History:  Diagnosis Date   Multiple sclerosis ([place]    Multiple sclerosis ([place]    Vaginal Pap smear, abnormal     Past Surgical History: Past Surgical History:  Procedure Laterality Date   ACHILLES TENDON REPAIR Right    ACHILLES TENDON SURGERY Right    WISDOM TOOTH EXTRACTION      Obstetrical History: OB History     Gravida  1   Para  0   Term  0   Preterm  0   AB  0   Living  0      SAB  0   IAB  0   Ectopic  0   Multiple  0   Live Births  0           Social History: Social History   Socioeconomic History   Marital status: Single    Spouse name: Not on file   Number of children: Not on file   Years of education: Not on file   Highest education level: Not on file  Occupational History   Not on file  Tobacco Use   Smoking status: Never   Smokeless tobacco: Never  Vaping Use   Vaping Use: Never used  Substance and Sexual Activity   Alcohol use: Not Currently    Comment: occasionally   Drug use: Never   Sexual activity: Yes    Birth control/protection: None  Other Topics Concern   Not on file  Social History Narrative   Not on file   Social Determinants of Health   Financial Resource Strain: Not on file  Food Insecurity: Not on file  Transportation Needs: Not on file  Physical Activity: Not on file  Stress: Not on file  Social Connections: Not on file    Family  History: Family History  Problem Relation Age of Onset   Asthma Mother    Healthy Mother    Cancer Maternal Grandmother    Diabetes Neg Hx    Heart disease Neg Hx    Hypertension Neg Hx    Stroke Neg Hx     Allergies: No Known Allergies  Medications Prior to Admission  Medication Sig Dispense Refill Last Dose   aspirin 81 MG chewable tablet Chew 1 tablet (81 mg total) by mouth daily. 90 tablet 2    cholecalciferol (VITAMIN D3) 25 MCG (1000 UNIT) tablet Take 2,000 Units by mouth daily.      Prenatal Vit-Fe Fumarate-FA (PRENATAL VITAMIN PO) Take by mouth.       Review of Systems:  All systems reviewed and negative except as stated in HPI  PE: Last menstrual period 09/09/2021. General appearance: alert, cooperative, and no distress Lungs: regular rate and effort Heart: regular rate  Abdomen: soft, non-tender Extremities: Homans sign is negative, no sign of DVT Presentation: cephalic EFM: [id number], mod variability, + accels, no decels Toco: UI SVE: 1.5/50/-2, vtx  Prenatal labs: ABO, Rh: AB/RH(D) POSITIVE/-- (08/01 0000) Antibody: NO  ANTIBODIES DETECTED (08/01 0000) Rubella: 3.55 (08/01 0000) RPR: NON-REACTIVE (12/08 0924)  HBsAg: NON-REACTIVE (08/01 0000)  HIV: NON-REACTIVE (12/08 0924)  GBS: Positive/-- (12/08 0000)  2 hr GTT nml  Prenatal Transfer Tool  Maternal Diabetes: No Genetic Screening: Normal Maternal Ultrasounds/Referrals: Normal Fetal Ultrasounds or other Referrals:  Referred to Materal Fetal Medicine  Maternal Substance Abuse:  No Significant Maternal Medications:  Ocrevus Significant Maternal Lab Results: Group B Strep positive (urine)  No results found for this or any previous visit (from the past 24 hour(s)).  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 G1P0000 at 61w0dhere for IOL for gHTN  1. Labor: latent 2. FWB: Cat I 3. Pain: analgesia/anesthesia prn 4. GBS: pos   Plan: Admit to LD Cervical ripening: consented for and placed FB, tolerated well Start Cytotec, dual dose PCN PEC labs Anticipate SVD  MJulianne Handler CNM  05/26/2022, 8:52 AM

## 2022-05-26 NOTE — Anesthesia Preprocedure Evaluation (Signed)
Anesthesia Evaluation  Patient identified by MRN, date of birth, ID band Patient awake    Reviewed: Allergy & Precautions, Patient's Chart, lab work & pertinent test results  Airway Mallampati: II       Dental no notable dental hx.    Pulmonary neg pulmonary ROS   Pulmonary exam normal        Cardiovascular hypertension, Normal cardiovascular exam  Gestational HTN on no Rx   Neuro/Psych MS - in remission during her pregnancy  negative psych ROS   GI/Hepatic Neg liver ROS,GERD  ,,  Endo/Other    Morbid obesityHyperlipidemia  Renal/GU negative Renal ROS  negative genitourinary   Musculoskeletal negative musculoskeletal ROS (+)    Abdominal  (+) + obese  Peds  Hematology  (+) Blood dyscrasia, anemia   Anesthesia Other Findings   Reproductive/Obstetrics (+) Pregnancy                              Anesthesia Physical Anesthesia Plan  ASA: 3  Anesthesia Plan: Epidural   Post-op Pain Management: Minimal or no pain anticipated   Induction:   PONV Risk Score and Plan:   Airway Management Planned: Natural Airway  Additional Equipment:   Intra-op Plan:   Post-operative Plan:   Informed Consent: I have reviewed the patients History and Physical, chart, labs and discussed the procedure including the risks, benefits and alternatives for the proposed anesthesia with the patient or authorized representative who has indicated his/her understanding and acceptance.       Plan Discussed with: Anesthesiologist  Anesthesia Plan Comments:          Anesthesia Quick Evaluation

## 2022-05-26 NOTE — Plan of Care (Signed)
  Problem: Education: Goal: Knowledge of General Education information will improve Description: Including pain rating scale, medication(s)/side effects and non-pharmacologic comfort measures Outcome: Progressing   Problem: Health Behavior/Discharge Planning: Goal: Ability to manage health-related needs will improve Outcome: Progressing   Problem: Clinical Measurements: Goal: Ability to maintain clinical measurements within normal limits will improve Outcome: Progressing Goal: Will remain free from infection Outcome: Progressing Goal: Diagnostic test results will improve Outcome: Progressing Goal: Respiratory complications will improve Outcome: Progressing Goal: Cardiovascular complication will be avoided Outcome: Progressing   Problem: Activity: Goal: Risk for activity intolerance will decrease Outcome: Progressing   Problem: Nutrition: Goal: Adequate nutrition will be maintained Outcome: Progressing   Problem: Coping: Goal: Level of anxiety will decrease Outcome: Progressing   Problem: Elimination: Goal: Will not experience complications related to bowel motility Outcome: Progressing Goal: Will not experience complications related to urinary retention Outcome: Progressing   Problem: Pain Managment: Goal: General experience of comfort will improve Outcome: Progressing   Problem: Safety: Goal: Ability to remain free from injury will improve Outcome: Progressing   Problem: Skin Integrity: Goal: Risk for impaired skin integrity will decrease Outcome: Progressing   Problem: Education: Goal: Knowledge of Childbirth will improve Outcome: Progressing Goal: Ability to make informed decisions regarding treatment and plan of care will improve Outcome: Progressing Goal: Ability to state and carry out methods to decrease the pain will improve Outcome: Progressing Goal: Individualized Educational Video(s) Outcome: Progressing   Problem: Coping: Goal: Ability to  verbalize concerns and feelings about labor and delivery will improve Outcome: Progressing   Problem: Life Cycle: Goal: Ability to make normal progression through stages of labor will improve Outcome: Progressing Goal: Ability to effectively push during vaginal delivery will improve Outcome: Progressing   Problem: Role Relationship: Goal: Will demonstrate positive interactions with the child Outcome: Progressing   Problem: Safety: Goal: Risk of complications during labor and delivery will decrease Outcome: Progressing   Problem: Pain Management: Goal: Relief or control of pain from uterine contractions will improve Outcome: Progressing   Problem: Education: Goal: Knowledge of disease or condition will improve Outcome: Progressing Goal: Knowledge of the prescribed therapeutic regimen will improve Outcome: Progressing   Problem: Fluid Volume: Goal: Peripheral tissue perfusion will improve Outcome: Progressing   Problem: Clinical Measurements: Goal: Complications related to disease process, condition or treatment will be avoided or minimized Outcome: Progressing   

## 2022-05-26 NOTE — Progress Notes (Signed)
Labor Progress Note Mateja Householder is a 32 y.o. G1P0000 at 86w0dpresented for IOL for gHTN. S/p Cytotec x1 and FB.  S:  Feeling ctx but manageable, currently in right lateral with peanut ball. Denies need for pain meds.  O:  BP 138/86 (BP Location: Left Arm)   Pulse 73   Temp 98.1 F (36.7 C) (Oral)   Resp 18   Ht 5' 2"$  (1.575 m)   Wt 98 kg   LMP 09/09/2021   BMI 39.51 kg/m  EFM: baseline 140 bpm/ mod variability/ + accels/ occ variable decels  Toco/IUPC: 2-3 SVE: Dilation: 5.5 Effacement (%): 60 Cervical Position: Middle Station: -2 Exam by:: AWallie RenshawRN Pitocin: 4 mu/min  A/P: 32y.o. G1P0000 353w0d1. Labor: latent 2. FWB: Cat II 3. Pain: analgesia/anesthesia/NO prn 4. gHTN: stable  Continue Pitocin titration. Consider AROM. Anticipate SVD.  MeJulianne HandlerCNM 6:50 PM

## 2022-05-27 ENCOUNTER — Encounter (HOSPITAL_COMMUNITY): Payer: Self-pay | Admitting: Obstetrics & Gynecology

## 2022-05-27 DIAGNOSIS — O134 Gestational [pregnancy-induced] hypertension without significant proteinuria, complicating childbirth: Secondary | ICD-10-CM

## 2022-05-27 DIAGNOSIS — Z3A37 37 weeks gestation of pregnancy: Secondary | ICD-10-CM

## 2022-05-27 DIAGNOSIS — O99820 Streptococcus B carrier state complicating pregnancy: Secondary | ICD-10-CM

## 2022-05-27 MED ORDER — IBUPROFEN 600 MG PO TABS
600.0000 mg | ORAL_TABLET | Freq: Four times a day (QID) | ORAL | Status: DC
  Administered 2022-05-27 – 2022-05-29 (×9): 600 mg via ORAL
  Filled 2022-05-27 (×9): qty 1

## 2022-05-27 MED ORDER — DIPHENHYDRAMINE HCL 25 MG PO CAPS: 25.0000 mg | ORAL_CAPSULE | Freq: Four times a day (QID) | ORAL | Status: DC | PRN

## 2022-05-27 MED ORDER — FUROSEMIDE 20 MG PO TABS
20.0000 mg | ORAL_TABLET | Freq: Every day | ORAL | Status: DC
  Administered 2022-05-27 – 2022-05-29 (×3): 20 mg via ORAL
  Filled 2022-05-27 (×3): qty 1

## 2022-05-27 MED ORDER — COCONUT OIL OIL: 1.0000 | TOPICAL_OIL | Status: DC | PRN

## 2022-05-27 MED ORDER — SENNOSIDES-DOCUSATE SODIUM 8.6-50 MG PO TABS
2.0000 | ORAL_TABLET | ORAL | Status: DC
  Administered 2022-05-27 – 2022-05-29 (×3): 2 via ORAL
  Filled 2022-05-27 (×3): qty 2

## 2022-05-27 MED ORDER — ONDANSETRON HCL 4 MG PO TABS: 4.0000 mg | ORAL_TABLET | ORAL | Status: DC | PRN

## 2022-05-27 MED ORDER — SIMETHICONE 80 MG PO CHEW: 80.0000 mg | CHEWABLE_TABLET | ORAL | Status: DC | PRN

## 2022-05-27 MED ORDER — ZOLPIDEM TARTRATE 5 MG PO TABS: 5.0000 mg | ORAL_TABLET | Freq: Every evening | ORAL | Status: DC | PRN

## 2022-05-27 MED ORDER — ACETAMINOPHEN 325 MG PO TABS
650.0000 mg | ORAL_TABLET | ORAL | Status: DC | PRN
  Administered 2022-05-27: 650 mg via ORAL
  Filled 2022-05-27: qty 2

## 2022-05-27 MED ORDER — PRENATAL MULTIVITAMIN CH
1.0000 | ORAL_TABLET | Freq: Every day | ORAL | Status: DC
  Administered 2022-05-27 – 2022-05-28 (×2): 1 via ORAL
  Filled 2022-05-27 (×2): qty 1

## 2022-05-27 MED ORDER — DIBUCAINE (PERIANAL) 1 % EX OINT: 1.0000 | TOPICAL_OINTMENT | CUTANEOUS | Status: DC | PRN

## 2022-05-27 MED ORDER — BENZOCAINE-MENTHOL 20-0.5 % EX AERO
1.0000 | INHALATION_SPRAY | CUTANEOUS | Status: DC | PRN
  Administered 2022-05-27: 1 via TOPICAL
  Filled 2022-05-27: qty 56

## 2022-05-27 MED ORDER — TETANUS-DIPHTH-ACELL PERTUSSIS 5-2.5-18.5 LF-MCG/0.5 IM SUSY: 0.5000 mL | PREFILLED_SYRINGE | Freq: Once | INTRAMUSCULAR | Status: DC

## 2022-05-27 MED ORDER — ONDANSETRON HCL 4 MG/2ML IJ SOLN: 4.0000 mg | INTRAMUSCULAR | Status: DC | PRN

## 2022-05-27 MED ORDER — WITCH HAZEL-GLYCERIN EX PADS: 1.0000 | MEDICATED_PAD | CUTANEOUS | Status: DC | PRN

## 2022-05-27 NOTE — Anesthesia Postprocedure Evaluation (Signed)
Anesthesia Post Note  Patient: Teresa Davila  Procedure(s) Performed: AN AD HOC LABOR EPIDURAL     Patient location during evaluation: Mother Baby Anesthesia Type: Epidural Level of consciousness: awake and alert Pain management: pain level controlled Vital Signs Assessment: post-procedure vital signs reviewed and stable Respiratory status: spontaneous breathing, nonlabored ventilation and respiratory function stable Cardiovascular status: stable Postop Assessment: no headache, no backache, epidural receding, no apparent nausea or vomiting, patient able to bend at knees, able to ambulate and adequate PO intake Anesthetic complications: no   No notable events documented.  Last Vitals:  Vitals:   05/27/22 0330 05/27/22 0743  BP: (!) 149/80 126/86  Pulse: 79 81  Resp: 18 18  Temp: 37.1 C   SpO2: 98% 100%    Last Pain:  Vitals:   05/27/22 0743  TempSrc: Oral  PainSc:    Pain Goal:                   Jabier Mutton

## 2022-05-27 NOTE — Plan of Care (Signed)
  Problem: Education: Goal: Knowledge of General Education information will improve Description: Including pain rating scale, medication(s)/side effects and non-pharmacologic comfort measures Outcome: Completed/Met   

## 2022-05-27 NOTE — Progress Notes (Signed)
The Rn called MD regarding the patient's increased BP. The MD gave parameters to call if BP was 160/110 or greater. MD will round on the patient in the AM.

## 2022-05-27 NOTE — Lactation Note (Signed)
This note was copied from a baby's chart. Lactation Consultation Note  Patient Name: Teresa Davila [id number] Date: 05/27/2022   Age:31 hours  Per RN mother has mulitple sclerosis and due to medications for now wants to give formula.  She will call if Lactation assistance is needed.      Vivianne Master [facility] 05/27/2022, 9:17 AM

## 2022-05-27 NOTE — Progress Notes (Signed)
Dr Caron Presume was present when Rn took patient's vital signs. MD saw BP and gave no further orders. 126/86.

## 2022-05-27 NOTE — Lactation Note (Signed)
This note was copied from a baby's chart. Lactation Consultation Note  Patient Name: Teresa Davila [id number] Date: 05/27/2022   Age:31 hours Per RN [name]) Birth Parent declined [facility] services in L&D.  Maternal Data    Feeding    LATCH Score                    Lactation Tools Discussed/Used    Interventions    Discharge    Consult Status      Eulis Canner 05/27/2022, 1:07 AM

## 2022-05-27 NOTE — Discharge Summary (Addendum)
Postpartum Discharge Summary    Patient Name: Teresa Davila DOB: 13-Feb-1991 MRN: [id number]  Date of admission: 05/26/2022 Delivery date:05/27/2022  Delivering provider: Concepcion Living  Date of discharge: 05/29/2022  Admitting diagnosis: Gestational hypertension [O13.9] Intrauterine pregnancy: [redacted]w[redacted]d    Secondary diagnosis:  Active Problems:   Multiple sclerosis ([place]   Supervision of normal first pregnancy   Elevated BP without diagnosis of hypertension   Asymptomatic bacteriuria   Gestational hypertension without significant proteinuria   Gestational hypertension   GBS bacteriuria   Vaginal delivery  Additional problems: None    Discharge diagnosis: Term Pregnancy Delivered and Gestational Hypertension                                              Post partum procedures: None Augmentation: Pitocin, Cytotec, and IP Foley Complications: None  Hospital course: Induction of Labor With Vaginal Delivery   32y.o. yo G1P0000 at 31w1das admitted to the hospital 05/26/2022 for induction of labor.  Indication for induction: Gestational hypertension.  Patient had an labor course complicated by elevated blood pressures with no severe range BP.  Membrane Rupture Time/Date: 8:30 PM ,05/26/2022   Delivery Method:Vaginal, Spontaneous  Episiotomy: None  Lacerations:  1st degree  Details of delivery can be found in separate delivery note.  Patient had a postpartum course complicated by borderline elevated BP and she was started on Lasxi '20mg'$  and Procardia XL '30mg'$ . Patient is discharged home 05/29/22.  Newborn Data: Birth date:05/27/2022  Birth time:12:30 AM  Gender:Female  Living status:Living  Apgars:9 ,9  Weight:2660 g   Magnesium Sulfate received: No BMZ received: No Rhophylac:N/A MMR:N/A T-DaP:Given prenatally Flu: No Transfusion:No  Physical exam  Vitals:   05/28/22 1004 05/28/22 1455 05/28/22 2054 05/29/22 0549  BP:  133/87 136/83 124/74  Pulse:  98 97 80  Resp:  '18  18 18  '$ Temp: 98.7 F (37.1 C) 98.2 F (36.8 C) 99 F (37.2 C) 98.5 F (36.9 C)  TempSrc: Oral Axillary  Oral  SpO2:  100% 100% 100%  Weight:      Height:       General: alert, cooperative, and no distress Lochia: appropriate Uterine Fundus: firm Incision: N/A DVT Evaluation: No evidence of DVT seen on physical exam. No significant calf/ankle edema. Labs: Lab Results  Component Value Date   WBC 15.8 (H) 05/26/2022   HGB 12.1 05/26/2022   HCT 35.3 (L) 05/26/2022   MCV 86.1 05/26/2022   PLT 259 05/26/2022      Latest Ref Rng & Units 05/26/2022    9:27 AM  CMP  Glucose 70 - 99 mg/dL 88   BUN 6 - 20 mg/dL 5   Creatinine 0.44 - 1.00 mg/dL 0.61   Sodium 135 - 145 mmol/L 135   Potassium 3.5 - 5.1 mmol/L 3.5   Chloride 98 - 111 mmol/L 103   CO2 22 - 32 mmol/L 21   Calcium 8.9 - 10.3 mg/dL 8.4   Total Protein 6.5 - 8.1 g/dL 5.8   Total Bilirubin 0.3 - 1.2 mg/dL 0.2   Alkaline Phos 38 - 126 U/L 102   AST 15 - 41 U/L 28   ALT 0 - 44 U/L 26    Edinburgh Score:    05/27/2022    3:57 PM  Edinburgh Postnatal Depression Scale Screening Tool  I have been able  to laugh and see the funny side of things. 0  I have looked forward with enjoyment to things. 0  I have blamed myself unnecessarily when things went wrong. 0  I have been anxious or worried for no good reason. 0  I have felt scared or panicky for no good reason. 0  Things have been getting on top of me. 0  I have been so unhappy that I have had difficulty sleeping. 0  I have felt sad or miserable. 0  I have been so unhappy that I have been crying. 0  The thought of harming myself has occurred to me. 0  Edinburgh Postnatal Depression Scale Total 0     After visit meds:  Allergies as of 05/29/2022   No Known Allergies      Medication List     STOP taking these medications    aspirin 81 MG chewable tablet       TAKE these medications    benzocaine-Menthol 20-0.5 % Aero Commonly known as:  DERMOPLAST Apply 1 Application topically as needed for irritation (perineal discomfort).   cholecalciferol 25 MCG (1000 UNIT) tablet Commonly known as: VITAMIN D3 Take 2,000 Units by mouth daily.   furosemide 20 MG tablet Commonly known as: LASIX Take 1 tablet (20 mg total) by mouth daily for 4 days.   ibuprofen 600 MG tablet Commonly known as: ADVIL Take 1 tablet (600 mg total) by mouth every 6 (six) hours.   NIFEdipine 30 MG 24 hr tablet Commonly known as: ADALAT CC Take 1 tablet (30 mg total) by mouth daily.   PRENATAL VITAMIN PO Take by mouth.   senna-docusate 8.6-50 MG tablet Commonly known as: Senokot-S Take 2 tablets by mouth daily for 5 days.         Discharge home in stable condition Infant Feeding: Bottle Infant Disposition:home with mother Discharge instruction: per After Visit Summary and Postpartum booklet. Activity: Advance as tolerated. Pelvic rest for 6 weeks.  Diet: routine diet Future Appointments: Future Appointments  Date Time Provider [place]  06/06/2022 11:10 AM Inez Catalina, MD [facility]  07/09/2022  8:30 AM Guss Bunde, MD [facility]  08/20/2022 10:50 AM Samuel Bouche, NP [facility] None   Follow up Visit: Message sent   Please schedule this patient for a In person postpartum visit in 6 weeks with the following provider: Any provider. Additional Postpartum F/U:BP check 1 week  High risk pregnancy complicated by: HTN Delivery mode:  Vaginal, Spontaneous  Anticipated Birth Control: Discuss at [facility] visit, would like to get back on MS meds first   Mallie Snooks, [place], MSN, CNM Certified Nurse Midwife, Product/process development scientist for [employer], [place]

## 2022-05-28 MED ORDER — NIFEDIPINE ER OSMOTIC RELEASE 30 MG PO TB24
30.0000 mg | ORAL_TABLET | Freq: Every day | ORAL | Status: DC
  Administered 2022-05-28 – 2022-05-29 (×2): 30 mg via ORAL
  Filled 2022-05-28 (×2): qty 1

## 2022-05-28 NOTE — Progress Notes (Signed)
POSTPARTUM PROGRESS NOTE  Post Partum Day 1  Subjective:  Teresa Davila is a 32 y.o. G1P1001 s/p VD at [redacted]w[redacted]d  She reports she is doing well. No acute events overnight. She denies any problems with ambulating, voiding or po intake. Denies nausea or vomiting.  Pain is {DESC; WELL/MODERATELY/POORLY:30679} controlled.  Lochia is ***.  Objective: Blood pressure (!) 139/91, pulse 86, temperature 98.7 F (37.1 C), temperature source Oral, resp. rate 18, height 5' 2"$  (1.575 m), weight 98 kg, last menstrual period 09/09/2021, SpO2 100 %, unknown if currently breastfeeding.  Physical Exam:  General: alert, cooperative and no distress Chest: no respiratory distress Heart:regular rate, distal pulses intact Abdomen: soft, nontender,  Uterine Fundus: firm, appropriately tender DVT Evaluation: No calf swelling or tenderness Extremities: *** edema Skin: warm, dry  Recent Labs    05/26/22 0927 05/26/22 2007  HGB 11.3* 12.1  HCT 32.7* 35.3*    Assessment/Plan: Teresa Albertiis a 32y.o. G1P1001 s/p *** at 366w1d PPD#*** - Doing well  Routine postpartum care *** Contraception: *** Feeding: *** Dispo: Plan for discharge ***.   LOS: 2 days   CaPhill MyronD.O. OB Fellow  05/28/2022, 11:45 AM

## 2022-05-29 ENCOUNTER — Other Ambulatory Visit (HOSPITAL_COMMUNITY): Payer: Self-pay

## 2022-05-29 MED ORDER — NIFEDIPINE ER 30 MG PO TB24
30.0000 mg | ORAL_TABLET | Freq: Every day | ORAL | 0 refills | Status: DC
  Filled 2022-05-29: qty 30, 30d supply, fill #0

## 2022-05-29 MED ORDER — FUROSEMIDE 20 MG PO TABS
20.0000 mg | ORAL_TABLET | Freq: Every day | ORAL | 0 refills | Status: DC
  Filled 2022-05-29: qty 4, 4d supply, fill #0

## 2022-05-29 MED ORDER — SENNOSIDES-DOCUSATE SODIUM 8.6-50 MG PO TABS
2.0000 | ORAL_TABLET | ORAL | 0 refills | Status: AC
  Filled 2022-05-29: qty 10, 5d supply, fill #0

## 2022-05-29 MED ORDER — IBUPROFEN 600 MG PO TABS
600.0000 mg | ORAL_TABLET | Freq: Four times a day (QID) | ORAL | 0 refills | Status: AC
  Filled 2022-05-29: qty 30, 8d supply, fill #0

## 2022-05-29 MED ORDER — BENZOCAINE-MENTHOL 20-0.5 % EX AERO
1.0000 | INHALATION_SPRAY | CUTANEOUS | 0 refills | Status: DC | PRN
  Filled 2022-05-29: qty 78, fill #0

## 2022-05-29 NOTE — Discharge Instructions (Signed)

## 2022-05-30 ENCOUNTER — Encounter: Payer: No Typology Code available for payment source | Admitting: Obstetrics and Gynecology

## 2022-06-06 ENCOUNTER — Other Ambulatory Visit: Payer: Self-pay | Admitting: Obstetrics and Gynecology

## 2022-06-06 ENCOUNTER — Ambulatory Visit (INDEPENDENT_AMBULATORY_CARE_PROVIDER_SITE_OTHER): Payer: No Typology Code available for payment source | Admitting: Obstetrics and Gynecology

## 2022-06-06 ENCOUNTER — Encounter: Payer: Self-pay | Admitting: Obstetrics and Gynecology

## 2022-06-06 VITALS — BP 134/93 | HR 84 | Ht 62.0 in | Wt 187.0 lb

## 2022-06-06 DIAGNOSIS — Z0130 Encounter for examination of blood pressure without abnormal findings: Secondary | ICD-10-CM

## 2022-06-06 MED ORDER — NIFEDIPINE ER 30 MG PO TB24: 60.0000 mg | ORAL_TABLET | Freq: Every day | ORAL | 0 refills | Status: DC

## 2022-06-06 NOTE — Telephone Encounter (Signed)
Rx already filled. 

## 2022-06-07 NOTE — Progress Notes (Signed)
   RETURN VISIT  Subjective:  Teresa Davila is a 32 y.o. G1P1001 who is 1 week s/p SVD presenting for BP check for gHTN  Currently on procardia XL 51mdaily. Home Bps 120-130s/80-90s. BP today 130s/90s x 2. Asymptomatic. Had HA that resolved w/tylenol, attributed to lack of sleep. Denies visual changes, CP/SOB, RUQ/epigastric pain. Reports she is recovering well.   Objective:   Vitals:   06/06/22 1118 06/06/22 1144  BP: (!) 131/90 (!) 134/93  Pulse: 84 84  Weight: 187 lb (84.8 kg)   Height: 5' 2"$  (1.575 m)     General:  Alert, oriented and cooperative. Patient is in no acute distress.  Skin: Skin is warm and dry. No rash noted.   Cardiovascular: Normal heart rate noted  Respiratory: Normal respiratory effort, no problems with respiration noted  Abdomen: Soft, non-tender, non-distended   Minimal pedal edema  Assessment and Plan:  Teresa Davila a 32y.o. here for blood pressure check for gHTN  Increase to procardia 60XL daily. Continue home BP monitoring. Discussed BP goals and when to call. Reviewed that we will likely be able to lower dose and discontinue has she progresses through postpartum recovery.  Future Appointments  Date Time Provider [place] 07/09/2022  8:30 AM LGuss Bunde MD [facility] 08/20/2022 10:50 AM JSamuel Bouche NP [facility] None   KInez Catalina MD

## 2022-06-13 ENCOUNTER — Encounter: Payer: No Typology Code available for payment source | Admitting: Obstetrics and Gynecology

## 2022-06-13 MED ORDER — NIFEDIPINE ER OSMOTIC RELEASE 30 MG PO TB24: 60.0000 mg | ORAL_TABLET | Freq: Every day | ORAL | 0 refills | Status: DC

## 2022-06-13 NOTE — Addendum Note (Signed)
Addended by: Gale Journey on: 06/13/2022 12:25 PM   Modules accepted: Orders

## 2022-06-15 DIAGNOSIS — Z419 Encounter for procedure for purposes other than remedying health state, unspecified: Secondary | ICD-10-CM | POA: Diagnosis not present

## 2022-06-18 ENCOUNTER — Encounter: Payer: Self-pay | Admitting: Obstetrics and Gynecology

## 2022-07-05 NOTE — Progress Notes (Signed)
    [facility] Partum Visit Note  Teresa Davila is a 32 y.o. G13P1001 female who presents for a postpartum visit. She is 6 weeks postpartum following a normal spontaneous vaginal delivery.  I have fully reviewed the prenatal and intrapartum course. The delivery was at 37.1 gestational weeks.  Anesthesia: epidural. Postpartum course has been uncomplicated. Baby is doing well. Baby is feeding by bottle. Bleeding no bleeding. Bowel function is normal. Bladder function is normal. Patient is not sexually active. Contraception method is none. Postpartum depression screening: negative.   The pregnancy intention screening data noted above was reviewed. Potential methods of contraception were discussed. The patient elected to proceed with No data recorded.    Health Maintenance Due  Topic Date Due   COVID-19 Vaccine (3 - Pfizer risk series) 04/28/2020    The following portions of the patient's history were reviewed and updated as appropriate: allergies, current medications, past family history, past medical history, past social history, past surgical history, and problem list.  Review of Systems Pertinent items noted in HPI and remainder of comprehensive ROS otherwise negative.  Objective:  LMP 09/09/2021    General:  alert and cooperative   Breasts:  Declines exam  Lungs: Normal effort  Heart:  Regular rate  Abdomen: Declines exam  Wound Declines exam  GU exam: Declines exam       Assessment:    32 yo female postpartum state s/p vaginal delivery  Plan:   Essential components of care per ACOG recommendations:  1.  Mood and well being: Patient with negative depression screening today. Reviewed local resources for support.  - Patient tobacco use? No.   - hx of drug use? No  2. Infant care and feeding:  -Patient currently breastmilk feeding? No.  -Social determinants of health (SDOH) reviewed in EPIC. No concerns  3. Sexuality, contraception and birth spacing - Patient does not want a  pregnancy in the next year.   - Reviewed reproductive life planning. Reviewed contraceptive methods based on pt preferences and effectiveness.  Patietn is still undecided.  Info given.  Discussed ocrevus with pharmacist and no contraindications to hormonal birth control  - Discussed birth spacing of 18 months  4. Sleep and fatigue -Encouraged family/partner/community support of 4 hrs of uninterrupted sleep to help with mood and fatigue  5. Physical Recovery  - Discussed patients delivery and complications. She describes her labor as good. - Patient had a Vaginal, no problems at delivery. Patient had a 1st degree laceration. Perineal healing reviewed. Patient expressed understanding - Patient has urinary incontinence? No. - Patient is safe to resume physical and sexual activity  6.  Health Maintenance - HM due items addressed Yes - Last pap smear  Diagnosis  Date Value Ref Range Status  11/14/2021   Final   - Negative for intraepithelial lesion or malignancy (NILM)   Pap smear not done at today's visit.  -Breast Cancer screening indicated? No.   7. Chronic Disease/Pregnancy Condition follow [id number] for MS--While taking Ocrevus birth control is recommended.   - PCP follow up--pt takes BP at home and is normal.  Normal at neurologist per patient.   Lyndal Rainbow, [place] for [employer], [place]

## 2022-07-08 ENCOUNTER — Encounter: Payer: Self-pay | Admitting: Obstetrics & Gynecology

## 2022-07-09 ENCOUNTER — Ambulatory Visit (INDEPENDENT_AMBULATORY_CARE_PROVIDER_SITE_OTHER): Payer: No Typology Code available for payment source | Admitting: Obstetrics & Gynecology

## 2022-07-09 ENCOUNTER — Encounter: Payer: Self-pay | Admitting: Obstetrics & Gynecology

## 2022-07-09 VITALS — BP 135/91 | HR 95 | Ht 62.0 in | Wt 186.0 lb

## 2022-07-09 DIAGNOSIS — G35 Multiple sclerosis: Secondary | ICD-10-CM

## 2022-07-16 DIAGNOSIS — Z419 Encounter for procedure for purposes other than remedying health state, unspecified: Secondary | ICD-10-CM | POA: Diagnosis not present

## 2022-08-15 DIAGNOSIS — Z419 Encounter for procedure for purposes other than remedying health state, unspecified: Secondary | ICD-10-CM | POA: Diagnosis not present

## 2022-08-20 ENCOUNTER — Ambulatory Visit (INDEPENDENT_AMBULATORY_CARE_PROVIDER_SITE_OTHER): Payer: No Typology Code available for payment source | Admitting: Medical-Surgical

## 2022-08-20 ENCOUNTER — Encounter: Payer: Self-pay | Admitting: Medical-Surgical

## 2022-08-20 VITALS — BP 127/87 | HR 79 | Resp 20 | Ht 62.0 in | Wt 186.3 lb

## 2022-08-20 DIAGNOSIS — Z0000 Encounter for general adult medical examination without abnormal findings: Secondary | ICD-10-CM

## 2022-08-20 DIAGNOSIS — D849 Immunodeficiency, unspecified: Secondary | ICD-10-CM

## 2022-08-20 DIAGNOSIS — N898 Other specified noninflammatory disorders of vagina: Secondary | ICD-10-CM

## 2022-08-20 LAB — POCT URINALYSIS DIP (CLINITEK)
Bilirubin, UA: NEGATIVE
Blood, UA: NEGATIVE
Glucose, UA: NEGATIVE mg/dL
Ketones, POC UA: NEGATIVE mg/dL
Nitrite, UA: NEGATIVE
POC PROTEIN,UA: NEGATIVE
Spec Grav, UA: 1.025 (ref 1.010–1.025)
Urobilinogen, UA: 0.2 E.U./dL
pH, UA: 6 (ref 5.0–8.0)

## 2022-08-20 LAB — WET PREP FOR TRICH, YEAST, CLUE
MICRO NUMBER:: 14917193
Specimen Quality: ADEQUATE

## 2022-08-20 NOTE — Progress Notes (Signed)
Complete physical exam  Patient: Teresa Davila   DOB: 10/01/1990   32 y.o. Female  MRN: [id number]  Subjective:    Chief Complaint  Patient presents with   Annual Exam    Tyranny Helmke is a 32 y.o. female who presents today for a complete physical exam. She reports consuming a general diet.  Goes to the gym regularly.  She generally feels well. She reports sleeping fairly well. She does not have additional problems to discuss today.    Most recent fall risk assessment:    08/20/2022   11:02 AM  Fall Risk   Falls in the past year? 0  Number falls in past yr: 0  Injury with Fall? 0  Risk for fall due to : No Fall Risks  Follow up Falls evaluation completed     Most recent depression screenings:    08/20/2022   11:02 AM 05/26/2022    4:19 PM  PHQ 2/9 Scores  PHQ - 2 Score 0 0  PHQ- 9 Score  0    Vision:Not within last year , Dental: No current dental problems and Receives regular dental care, and STD: The patient denies history of sexually transmitted disease.    Patient Care Team: Christen Butter, NP as PCP - General (Nurse Practitioner) Rasch, Harolyn Rutherford, NP as PCP - OBGYN (Obstetrics and Gynecology) Adam Phenix, MD as Consulting Physician (Obstetrics and Gynecology)   Outpatient Medications Prior to Visit  Medication Sig   cholecalciferol (VITAMIN D3) 25 MCG (1000 UNIT) tablet Take 2,000 Units by mouth daily.   ibuprofen (ADVIL) 600 MG tablet Take 1 tablet (600 mg total) by mouth every 6 (six) hours.   Multiple Vitamin (MULTIVITAMIN) tablet Take 1 tablet by mouth daily.   Ocrelizumab (OCREVUS IV) Inject into the vein.   No facility-administered medications prior to visit.    Review of Systems  Constitutional:  Negative for chills, fever, malaise/fatigue and weight loss.  HENT:  Negative for congestion, ear pain, hearing loss, sinus pain and sore throat.   Eyes:  Negative for blurred vision, photophobia and pain.  Respiratory:  Negative for cough, shortness of  breath and wheezing.   Cardiovascular:  Negative for chest pain, palpitations and leg swelling.  Gastrointestinal:  Negative for abdominal pain, constipation, diarrhea, heartburn, nausea and vomiting.  Genitourinary:  Negative for dysuria, frequency and urgency.  Musculoskeletal:  Negative for falls and neck pain.  Skin:  Negative for itching and rash.  Neurological:  Negative for dizziness, weakness and headaches.  Endo/Heme/Allergies:  Negative for polydipsia. Does not bruise/bleed easily.  Psychiatric/Behavioral:  Negative for depression, substance abuse and suicidal ideas. The patient is not nervous/anxious.      Objective:    BP 127/87 (BP Location: Left Arm, Cuff Size: Normal)   Pulse 79   Resp 20   Ht 5\' 2"  (1.575 m)   Wt 186 lb 4.8 oz (84.5 kg)   SpO2 100%   Breastfeeding No   BMI 34.07 kg/m    Physical Exam Constitutional:      General: She is not in acute distress.    Appearance: Normal appearance. She is not ill-appearing.  HENT:     Head: Normocephalic and atraumatic.     Right Ear: Tympanic membrane, ear canal and external ear normal. There is no impacted cerumen.     Left Ear: Tympanic membrane, ear canal and external ear normal. There is no impacted cerumen.     Nose: Nose normal. No congestion or rhinorrhea.  Mouth/Throat:     Mouth: Mucous membranes are moist.     Pharynx: No oropharyngeal exudate or posterior oropharyngeal erythema.  Eyes:     General: No scleral icterus.       Right eye: No discharge.        Left eye: No discharge.     Extraocular Movements: Extraocular movements intact.     Conjunctiva/sclera: Conjunctivae normal.     Pupils: Pupils are equal, round, and reactive to light.  Neck:     Thyroid: No thyromegaly.     Vascular: No carotid bruit or JVD.     Trachea: Trachea normal.  Cardiovascular:     Rate and Rhythm: Normal rate and regular rhythm.     Pulses: Normal pulses.     Heart sounds: Normal heart sounds. No murmur heard.     No friction rub. No gallop.  Pulmonary:     Effort: Pulmonary effort is normal. No respiratory distress.     Breath sounds: Normal breath sounds. No wheezing.  Abdominal:     General: Bowel sounds are normal. There is no distension.     Palpations: Abdomen is soft.     Tenderness: There is no abdominal tenderness. There is no guarding.  Musculoskeletal:        General: Normal range of motion.     Cervical back: Normal range of motion and neck supple.  Lymphadenopathy:     Cervical: No cervical adenopathy.  Skin:    General: Skin is warm and dry.  Neurological:     Mental Status: She is alert and oriented to person, place, and time.     Cranial Nerves: No cranial nerve deficit.  Psychiatric:        Mood and Affect: Mood normal.        Behavior: Behavior normal.        Thought Content: Thought content normal.        Judgment: Judgment normal.   No results found for any visits on 08/20/22.     Assessment & Plan:    Routine Health Maintenance and Physical Exam  Immunization History  Administered Date(s) Administered   DTaP 08/28/1991, 10/17/1991, 12/25/1991, 09/20/1992   HIB (PRP-OMP) 08/28/1991, 10/17/1991, 12/25/1991, 09/20/1992   HPV 9-valent 01/31/2006, 04/30/2014, 05/02/2015   HPV Quadrivalent 01/31/2006, 04/30/2014, 05/02/2015   Hepatitis B 06/29/1991, 08/28/1991, 12/25/1991   IPV 08/28/1991, 10/17/1991, 09/20/1992   Influenza Split 03/09/2014   Influenza, Seasonal, Injecte, Preservative Fre 03/09/2014   Influenza-Unspecified 01/20/2020   MMR 09/20/1992   Meningococcal Conjugate 01/31/2006   PFIZER(Purple Top)SARS-COV-2 Vaccination 03/08/2020, 03/31/2020   Td 01/31/2006, 06/22/2009   Tdap 07/28/2015, 03/23/2022    Health Maintenance  Topic Date Due   COVID-19 Vaccine (3 - Pfizer risk series) 09/05/2022 (Originally 04/28/2020)   INFLUENZA VACCINE  11/15/2022   PAP SMEAR-Modifier  11/14/2024   DTaP/Tdap/Td (9 - Td or Tdap) 03/23/2032   HPV VACCINES  Completed    Hepatitis C Screening  Completed   HIV Screening  Completed    Discussed health benefits of physical activity, and encouraged her to engage in regular exercise appropriate for her age and condition.  1. Immunosuppression (HCC) Managed by Neurology with [facility].  2. Annual physical exam Checking labs as below. UTD on preventative care. Wellness information provided with AVS.  - CBC with Differential/Platelet - COMPLETE METABOLIC PANEL WITH GFR - Lipid panel  3. Vaginal irritation 4. Vaginal discharge STAT wet prep today. POCT UA + small leuks. Sending for culture.  -  WET PREP FOR TRICH, YEAST, CLUE - POCT URINALYSIS DIP (CLINITEK) - Urine Culture  Return in about 1 year (around 08/20/2023) for annual physical exam or sooner if needed.   Christen Butter, NP

## 2022-08-21 LAB — URINE CULTURE
MICRO NUMBER:: 14917643
SPECIMEN QUALITY:: ADEQUATE

## 2022-08-21 MED ORDER — METRONIDAZOLE 500 MG PO TABS: 500.0000 mg | ORAL_TABLET | Freq: Two times a day (BID) | ORAL | 0 refills | Status: DC

## 2022-08-21 NOTE — Addendum Note (Signed)
Addended byChristen Butter on: 08/21/2022 11:57 AM   Modules accepted: Orders

## 2022-08-22 ENCOUNTER — Encounter: Payer: Self-pay | Admitting: Medical-Surgical

## 2022-08-22 ENCOUNTER — Other Ambulatory Visit: Payer: Self-pay | Admitting: Medical-Surgical

## 2022-08-22 MED ORDER — METRONIDAZOLE 0.75 % EX GEL: 1.0000 | Freq: Every day | CUTANEOUS | 0 refills | Status: AC

## 2022-08-28 LAB — COMPLETE METABOLIC PANEL WITH GFR
AG Ratio: 1.4 (calc) (ref 1.0–2.5)
ALT: 17 U/L (ref 6–29)
AST: 15 U/L (ref 10–30)
Albumin: 4.2 g/dL (ref 3.6–5.1)
Alkaline phosphatase (APISO): 91 U/L (ref 31–125)
BUN: 11 mg/dL (ref 7–25)
CO2: 21 mmol/L (ref 20–32)
Calcium: 9.4 mg/dL (ref 8.6–10.2)
Chloride: 105 mmol/L (ref 98–110)
Creat: 0.8 mg/dL (ref 0.50–0.97)
Globulin: 3.1 g/dL (calc) (ref 1.9–3.7)
Glucose, Bld: 126 mg/dL — ABNORMAL HIGH (ref 65–99)
Potassium: 3.8 mmol/L (ref 3.5–5.3)
Sodium: 137 mmol/L (ref 135–146)
Total Bilirubin: 0.4 mg/dL (ref 0.2–1.2)
Total Protein: 7.3 g/dL (ref 6.1–8.1)
eGFR: 101 mL/min/{1.73_m2} (ref >=60)

## 2022-08-28 LAB — CBC WITH DIFFERENTIAL/PLATELET
Absolute Monocytes: 680 cells/uL (ref 200–950)
Basophils Absolute: 27 cells/uL (ref 0–200)
Basophils Relative: 0.4 %
Eosinophils Absolute: 41 cells/uL (ref 15–500)
Eosinophils Relative: 0.6 %
HCT: 40 % (ref 35.0–45.0)
Hemoglobin: 13 g/dL (ref 11.7–15.5)
Lymphs Abs: 1693 cells/uL (ref 850–3900)
MCH: 27.5 pg (ref 27.0–33.0)
MCHC: 32.5 g/dL (ref 32.0–36.0)
MCV: 84.7 fL (ref 80.0–100.0)
MPV: 11.8 fL (ref 7.5–12.5)
Monocytes Relative: 10 %
Neutro Abs: 4359 cells/uL (ref 1500–7800)
Neutrophils Relative %: 64.1 %
Platelets: 435 10*3/uL — ABNORMAL HIGH (ref 140–400)
RBC: 4.72 10*6/uL (ref 3.80–5.10)
RDW: 13.6 % (ref 11.0–15.0)
Total Lymphocyte: 24.9 %
WBC: 6.8 10*3/uL (ref 3.8–10.8)

## 2022-08-28 LAB — LIPID PANEL
Cholesterol: 236 mg/dL — ABNORMAL HIGH (ref <200)
HDL: 67 mg/dL (ref >=50)
LDL Cholesterol (Calc): 149 mg/dL (calc) — ABNORMAL HIGH
Non-HDL Cholesterol (Calc): 169 mg/dL (calc) — ABNORMAL HIGH (ref <130)
Total CHOL/HDL Ratio: 3.5 (calc) (ref <5.0)
Triglycerides: 90 mg/dL (ref <150)

## 2022-08-28 LAB — HEMOGLOBIN A1C W/OUT EAG: Hgb A1c MFr Bld: 5.3 % of total Hgb (ref <5.7)

## 2022-09-15 DIAGNOSIS — Z419 Encounter for procedure for purposes other than remedying health state, unspecified: Secondary | ICD-10-CM | POA: Diagnosis not present

## 2022-10-15 DIAGNOSIS — Z419 Encounter for procedure for purposes other than remedying health state, unspecified: Secondary | ICD-10-CM | POA: Diagnosis not present

## 2022-11-15 DIAGNOSIS — Z419 Encounter for procedure for purposes other than remedying health state, unspecified: Secondary | ICD-10-CM | POA: Diagnosis not present

## 2022-12-16 DIAGNOSIS — Z419 Encounter for procedure for purposes other than remedying health state, unspecified: Secondary | ICD-10-CM | POA: Diagnosis not present

## 2023-01-03 DIAGNOSIS — G35 Multiple sclerosis: Secondary | ICD-10-CM | POA: Diagnosis not present

## 2023-01-15 DIAGNOSIS — Z419 Encounter for procedure for purposes other than remedying health state, unspecified: Secondary | ICD-10-CM | POA: Diagnosis not present

## 2023-02-15 DIAGNOSIS — Z419 Encounter for procedure for purposes other than remedying health state, unspecified: Secondary | ICD-10-CM | POA: Diagnosis not present

## 2023-03-17 DIAGNOSIS — Z419 Encounter for procedure for purposes other than remedying health state, unspecified: Secondary | ICD-10-CM | POA: Diagnosis not present

## 2023-03-28 DIAGNOSIS — G35 Multiple sclerosis: Secondary | ICD-10-CM | POA: Diagnosis not present

## 2023-04-17 DIAGNOSIS — Z419 Encounter for procedure for purposes other than remedying health state, unspecified: Secondary | ICD-10-CM | POA: Diagnosis not present

## 2023-05-18 DIAGNOSIS — Z419 Encounter for procedure for purposes other than remedying health state, unspecified: Secondary | ICD-10-CM | POA: Diagnosis not present

## 2023-06-18 ENCOUNTER — Ambulatory Visit: Payer: No Typology Code available for payment source | Admitting: Obstetrics and Gynecology

## 2023-06-18 ENCOUNTER — Encounter: Payer: Self-pay | Admitting: Obstetrics and Gynecology

## 2023-06-18 ENCOUNTER — Other Ambulatory Visit (HOSPITAL_COMMUNITY)
Admission: RE | Admit: 2023-06-18 | Discharge: 2023-06-18 | Disposition: A | Discharge status: Home / Self Care | Source: Ambulatory Visit | Attending: Obstetrics and Gynecology | Admitting: Obstetrics and Gynecology

## 2023-06-18 VITALS — BP 137/84 | HR 80 | Resp 16 | Ht 62.0 in | Wt 186.0 lb

## 2023-06-18 DIAGNOSIS — Z113 Encounter for screening for infections with a predominantly sexual mode of transmission: Secondary | ICD-10-CM | POA: Insufficient documentation

## 2023-06-18 NOTE — Progress Notes (Signed)
 GYNECOLOGY ANNUAL PREVENTATIVE CARE ENCOUNTER NOTE  History:     Teresa Davila is a 33 y.o. G54P1001 female here for a routine annual gynecologic exam.  Current complaints: None.   Denies abnormal vaginal bleeding, discharge, pelvic pain, problems with intercourse or other gynecologic concerns.    Gynecologic History Patient's last menstrual period was 05/26/2023. Contraception: abstinence Last Pap: 11/14/2021. Result was normal with negative HPV Last Mammogram: NA  Obstetric History OB History  Gravida Para Term Preterm AB Living  1 1 1  0 0 1  SAB IAB Ectopic Multiple Live Births  0 0 0 0 1    # Outcome Date GA Lbr Len/2nd Weight Sex Type Anes PTL Lv  1 Term 05/27/22 [redacted]w[redacted]d / 00:42 5 lb 13.8 oz (2.66 kg) F Vag-Spont EPI  LIV    Past Medical History:  Diagnosis Date   Multiple sclerosis (HCC)    Multiple sclerosis (HCC)    Vaginal Pap smear, abnormal     Past Surgical History:  Procedure Laterality Date   ACHILLES TENDON REPAIR Right    ACHILLES TENDON SURGERY Right    WISDOM TOOTH EXTRACTION      Current Outpatient Medications on File Prior to Visit  Medication Sig Dispense Refill   cholecalciferol (VITAMIN D3) 25 MCG (1000 UNIT) tablet Take 2,000 Units by mouth daily.     ibuprofen (ADVIL) 600 MG tablet Take 1 tablet (600 mg total) by mouth every 6 (six) hours. 30 tablet 0   Multiple Vitamin (MULTIVITAMIN) tablet Take 1 tablet by mouth daily.     Ocrelizumab (OCREVUS IV) Inject into the vein.     No current facility-administered medications on file prior to visit.    No Known Allergies  Social History:  reports that she has never smoked. She has never used smokeless tobacco. She reports that she does not currently use alcohol. She reports that she does not use drugs.  Family History  Problem Relation Age of Onset   Asthma Mother    Healthy Mother    Cancer Maternal Grandmother    Diabetes Neg Hx    Heart disease Neg Hx    Hypertension Neg Hx    Stroke  Neg Hx     The following portions of the patient's history were reviewed and updated as appropriate: allergies, current medications, past family history, past medical history, past social history, past surgical history and problem list.  Review of Systems Pertinent items noted in HPI and remainder of comprehensive ROS otherwise negative.  Physical Exam:  BP 137/84   Pulse 80   Resp 16   Ht 5\' 2"  (1.575 m)   Wt 186 lb (84.4 kg)   LMP 05/26/2023   Breastfeeding No   BMI 34.02 kg/m  CONSTITUTIONAL: Well-developed, well-nourished female in no acute distress.  HENT:  Normocephalic, atraumatic, External right and left ear normal.  EYES: Conjunctivae and EOM are normal. Pupils are equal, round, and reactive to light. No scleral icterus.  NECK: Normal range of motion, supple, no masses.  Normal thyroid.  SKIN: Skin is warm and dry. No rash noted. Not diaphoretic. No erythema. No pallor. MUSCULOSKELETAL: Normal range of motion. No tenderness.  No cyanosis, clubbing, or edema. NEUROLOGIC: Alert and oriented to person, place, and time. Normal reflexes, muscle tone coordination.  PSYCHIATRIC: Normal mood and affect. Normal behavior. Normal judgment and thought content. CARDIOVASCULAR: Normal heart rate noted, regular rhythm RESPIRATORY: Clear to auscultation bilaterally. Effort and breath sounds normal, no problems with respiration noted. BREASTS:  Symmetric in size. No masses, tenderness, skin changes, nipple drainage, or lymphadenopathy bilaterally. Performed in the presence of a chaperone. ABDOMEN: Soft, no distention noted.  No tenderness, rebound or guarding.  PELVIC: Normal appearing external genitalia and urethral meatus; Pap not collected. Performed in the presence of a chaperone.   Assessment and Plan:   1. Screening examination for STI (Primary)  - Cervicovaginal ancillary only( [place])  - pap not due.   Normal breast examination today, she was advised to perform periodic  self breast examinations.  Routine preventative health maintenance measures emphasized. Please refer to After Visit Summary for other counseling recommendations.    Kahliya Fraleigh, Harolyn Rutherford, NP [facility] for [employer], [facility]

## 2023-06-19 LAB — CERVICOVAGINAL ANCILLARY ONLY
Chlamydia: NEGATIVE
Comment: NEGATIVE
Comment: NEGATIVE
Comment: NORMAL
Neisseria Gonorrhea: NEGATIVE
Trichomonas: NEGATIVE

## 2023-07-03 DIAGNOSIS — G35 Multiple sclerosis: Secondary | ICD-10-CM | POA: Diagnosis not present

## 2023-10-03 DIAGNOSIS — G35 Multiple sclerosis: Secondary | ICD-10-CM | POA: Diagnosis not present

## 2023-11-01 ENCOUNTER — Ambulatory Visit (INDEPENDENT_AMBULATORY_CARE_PROVIDER_SITE_OTHER): Admitting: Medical-Surgical

## 2023-11-01 ENCOUNTER — Encounter: Payer: Self-pay | Admitting: Medical-Surgical

## 2023-11-01 VITALS — BP 129/84 | HR 99 | Resp 20 | Ht 62.0 in | Wt 194.0 lb

## 2023-11-01 DIAGNOSIS — Z8639 Personal history of other endocrine, nutritional and metabolic disease: Secondary | ICD-10-CM | POA: Diagnosis not present

## 2023-11-01 DIAGNOSIS — Z0000 Encounter for general adult medical examination without abnormal findings: Secondary | ICD-10-CM | POA: Diagnosis not present

## 2023-11-01 DIAGNOSIS — E782 Mixed hyperlipidemia: Secondary | ICD-10-CM

## 2023-11-01 NOTE — Patient Instructions (Signed)

## 2023-11-01 NOTE — Progress Notes (Signed)
 Complete physical exam  Patient: Teresa Davila   DOB: 1990-06-03   33 y.o. Female  MRN: [id number]  Subjective:    Chief Complaint  Patient presents with   Annual Exam    Cimberly Stoffel is a 33 y.o. female who presents today for a complete physical exam. She reports consuming a general diet. Doing strength training and cardio regularly. She generally feels well. She reports sleeping fairly well. She does not have additional problems to discuss today.    Most recent fall risk assessment:    08/20/2022   11:02 AM  Fall Risk   Falls in the past year? 0  Number falls in past yr: 0  Injury with Fall? 0  Risk for fall due to : No Fall Risks  Follow up Falls evaluation completed     Most recent depression screenings:    08/20/2022   11:02 AM 05/26/2022    4:19 PM  PHQ 2/9 Scores  PHQ - 2 Score 0 0  PHQ- 9 Score  0    Vision:Within last year and Dental: No current dental problems and Receives regular dental care    Patient Care Team: Willo Mini, NP as PCP - General (Nurse Practitioner) Rasch, Delon FERNS, NP as PCP - OBGYN (Obstetrics and Gynecology) Eveline Lynwood MATSU, MD as Consulting Physician (Obstetrics and Gynecology)   Outpatient Medications Prior to Visit  Medication Sig   cholecalciferol (VITAMIN D3) 25 MCG (1000 UNIT) tablet Take 2,000 Units by mouth daily.   ibuprofen  (ADVIL ) 600 MG tablet Take 1 tablet (600 mg total) by mouth every 6 (six) hours.   Multiple Vitamin (MULTIVITAMIN) tablet Take 1 tablet by mouth daily.   Ocrelizumab  (OCREVUS  IV) Inject into the vein.   No facility-administered medications prior to visit.    Review of Systems  Constitutional:  Negative for chills, fever, malaise/fatigue and weight loss.  HENT:  Negative for congestion, ear pain, hearing loss, sinus pain and sore throat.   Eyes:  Negative for blurred vision, photophobia and pain.  Respiratory:  Negative for cough, shortness of breath and wheezing.   Cardiovascular:  Negative for  chest pain, palpitations and leg swelling.  Gastrointestinal:  Negative for abdominal pain, constipation, diarrhea, heartburn, nausea and vomiting.  Genitourinary:  Negative for dysuria, frequency and urgency.  Musculoskeletal:  Negative for falls and neck pain.  Skin:  Negative for itching and rash.  Neurological:  Negative for dizziness, weakness and headaches.  Endo/Heme/Allergies:  Negative for polydipsia. Does not bruise/bleed easily.  Psychiatric/Behavioral:  Negative for depression, substance abuse and suicidal ideas. The patient is not nervous/anxious.      Objective:    BP 129/84 (BP Location: Right Arm, Cuff Size: Normal)   Pulse 99   Resp 20   Ht 5' 2 (1.575 m)   Wt 194 lb 0.6 oz (88 kg)   SpO2 98%   BMI 35.49 kg/m    Physical Exam Constitutional:      General: She is not in acute distress.    Appearance: Normal appearance. She is not ill-appearing.  HENT:     Head: Normocephalic and atraumatic.     Right Ear: Tympanic membrane, ear canal and external ear normal. There is no impacted cerumen.     Left Ear: Tympanic membrane, ear canal and external ear normal. There is no impacted cerumen.     Nose: Nose normal. No congestion or rhinorrhea.     Mouth/Throat:     Mouth: Mucous membranes are moist.  Pharynx: No oropharyngeal exudate or posterior oropharyngeal erythema.  Eyes:     General: No scleral icterus.       Right eye: No discharge.        Left eye: No discharge.     Extraocular Movements: Extraocular movements intact.     Conjunctiva/sclera: Conjunctivae normal.     Pupils: Pupils are equal, round, and reactive to light.  Neck:     Thyroid: No thyromegaly.     Vascular: No carotid bruit or JVD.     Trachea: Trachea normal.  Cardiovascular:     Rate and Rhythm: Normal rate and regular rhythm.     Pulses: Normal pulses.     Heart sounds: Normal heart sounds. No murmur heard.    No friction rub. No gallop.  Pulmonary:     Effort: Pulmonary effort is  normal. No respiratory distress.     Breath sounds: Normal breath sounds. No wheezing.  Abdominal:     General: Bowel sounds are normal. There is no distension.     Palpations: Abdomen is soft.     Tenderness: There is no abdominal tenderness. There is no guarding.  Musculoskeletal:        General: Normal range of motion.     Cervical back: Normal range of motion and neck supple.  Lymphadenopathy:     Cervical: No cervical adenopathy.  Skin:    General: Skin is warm and dry.  Neurological:     Mental Status: She is alert and oriented to person, place, and time.     Cranial Nerves: No cranial nerve deficit.  Psychiatric:        Mood and Affect: Mood normal.        Behavior: Behavior normal.        Thought Content: Thought content normal.        Judgment: Judgment normal.    No results found for any visits on 11/01/23.     Assessment & Plan:    Routine Health Maintenance and Physical Exam  Immunization History  Administered Date(s) Administered   DTaP 08/28/1991, 10/17/1991, 12/25/1991, 09/20/1992   HIB (PRP-OMP) 08/28/1991, 10/17/1991, 12/25/1991, 09/20/1992   HPV 9-valent 01/31/2006, 04/30/2014, 05/02/2015   HPV Quadrivalent 01/31/2006, 04/30/2014, 05/02/2015   Hepatitis B 06/29/1991, 08/28/1991, 12/25/1991   IPV 08/28/1991, 10/17/1991, 09/20/1992   Influenza Split 03/09/2014   Influenza, Seasonal, Injecte, Preservative Fre 03/09/2014   Influenza-Unspecified 01/20/2020   MMR 09/20/1992   Meningococcal Conjugate 01/31/2006   PFIZER(Purple Top)SARS-COV-2 Vaccination 03/08/2020, 03/31/2020   Td 01/31/2006, 06/22/2009   Tdap 07/28/2015, 03/23/2022    Health Maintenance  Topic Date Due   Pneumococcal Vaccine 34-38 Years old (1 of 2 - PCV) Never done   COVID-19 Vaccine (3 - Pfizer risk series) 11/17/2023 (Originally 04/28/2020)   INFLUENZA VACCINE  11/15/2023   Cervical Cancer Screening (HPV/Pap Cotest)  11/15/2026   DTaP/Tdap/Td (9 - Td or Tdap) 03/23/2032   Hepatitis  B Vaccines  Completed   HPV VACCINES  Completed   Hepatitis C Screening  Completed   HIV Screening  Completed   Meningococcal B Vaccine  Aged Out    Discussed health benefits of physical activity, and encouraged her to engage in regular exercise appropriate for her age and condition.  1. Annual physical exam (Primary) Checking labs as below. UTD on preventative care. Wellness information provided with AVS. - CBC with Differential/Platelet - CMP14+EGFR - Lipid panel  2. Mixed hyperlipidemia Checking labs. - CMP14+EGFR - Lipid panel  3. H/O vitamin D   deficiency Checking vitamin D . - VITAMIN D  25 Hydroxy (Vit-D Deficiency, Fractures)   Return in about 1 year (around 10/31/2024) for annual physical exam or sooner if needed.     Beckem Tomberlin, NP

## 2023-11-03 ENCOUNTER — Ambulatory Visit: Payer: Self-pay | Admitting: Medical-Surgical

## 2023-11-05 LAB — CBC WITH DIFFERENTIAL/PLATELET
Basophils Absolute: 0 x10E3/uL (ref 0.0–0.2)
Basos: 1 %
EOS (ABSOLUTE): 0.2 x10E3/uL (ref 0.0–0.4)
Eos: 2 %
Hematocrit: 44.2 % (ref 34.0–46.6)
Hemoglobin: 14.1 g/dL (ref 11.1–15.9)
Immature Grans (Abs): 0 x10E3/uL (ref 0.0–0.1)
Immature Granulocytes: 0 %
Lymphocytes Absolute: 1.7 x10E3/uL (ref 0.7–3.1)
Lymphs: 22 %
MCH: 29.3 pg (ref 26.6–33.0)
MCHC: 31.9 g/dL (ref 31.5–35.7)
MCV: 92 fL (ref 79–97)
Monocytes Absolute: 0.7 x10E3/uL (ref 0.1–0.9)
Monocytes: 9 %
Neutrophils Absolute: 5.2 x10E3/uL (ref 1.4–7.0)
Neutrophils: 66 %
Platelets: 425 x10E3/uL (ref 150–450)
RBC: 4.82 x10E6/uL (ref 3.77–5.28)
RDW: 12.7 % (ref 11.7–15.4)
WBC: 7.9 x10E3/uL (ref 3.4–10.8)

## 2023-11-05 LAB — LIPID PANEL
Chol/HDL Ratio: 4 ratio (ref 0.0–4.4)
Cholesterol, Total: 220 mg/dL — ABNORMAL HIGH (ref 100–199)
HDL: 55 mg/dL (ref >39)
LDL Chol Calc (NIH): 150 mg/dL — ABNORMAL HIGH (ref 0–99)
Triglycerides: 86 mg/dL (ref 0–149)
VLDL Cholesterol Cal: 15 mg/dL (ref 5–40)

## 2023-11-05 LAB — CMP14+EGFR
ALT: 20 IU/L (ref 0–32)
AST: 18 IU/L (ref 0–40)
Albumin: 4.4 g/dL (ref 3.9–4.9)
Alkaline Phosphatase: 88 IU/L (ref 44–121)
BUN/Creatinine Ratio: 14 (ref 9–23)
BUN: 11 mg/dL (ref 6–20)
Bilirubin Total: 0.3 mg/dL (ref 0.0–1.2)
CO2: 20 mmol/L (ref 20–29)
Calcium: 9.3 mg/dL (ref 8.7–10.2)
Chloride: 104 mmol/L (ref 96–106)
Creatinine, Ser: 0.78 mg/dL (ref 0.57–1.00)
Globulin, Total: 2.6 g/dL (ref 1.5–4.5)
Glucose: 92 mg/dL (ref 70–99)
Potassium: 4.5 mmol/L (ref 3.5–5.2)
Sodium: 137 mmol/L (ref 134–144)
Total Protein: 7 g/dL (ref 6.0–8.5)
eGFR: 103 mL/min/1.73 (ref >59)

## 2023-11-05 LAB — VITAMIN D 25 HYDROXY (VIT D DEFICIENCY, FRACTURES): Vit D, 25-Hydroxy: 25.1 ng/mL — ABNORMAL LOW (ref 30.0–100.0)

## 2023-11-06 ENCOUNTER — Encounter: Payer: Self-pay | Admitting: Medical-Surgical

## 2024-01-02 DIAGNOSIS — G35 Multiple sclerosis: Secondary | ICD-10-CM | POA: Diagnosis not present
